# Patient Record
Sex: Female | Born: 1955 | Race: Black or African American | Hispanic: No | Smoking: Former smoker
Health system: Southern US, Community
[De-identification: ages and names within clinical notes are randomized; demographics above are authoritative.]

## PROBLEM LIST (undated history)

## (undated) DIAGNOSIS — I10 Essential (primary) hypertension: Secondary | ICD-10-CM

## (undated) HISTORY — PX: TONSILLECTOMY: SUR1361

---

## 1999-05-07 ENCOUNTER — Emergency Department (HOSPITAL_COMMUNITY): Admission: EM | Admit: 1999-05-07 | Discharge: 1999-05-07 | Payer: Self-pay | Admitting: Emergency Medicine

## 2004-03-04 ENCOUNTER — Emergency Department (HOSPITAL_COMMUNITY): Admission: EM | Admit: 2004-03-04 | Discharge: 2004-03-05 | Payer: Self-pay | Admitting: Emergency Medicine

## 2005-02-07 ENCOUNTER — Emergency Department (HOSPITAL_COMMUNITY): Admission: EM | Admit: 2005-02-07 | Discharge: 2005-02-07 | Payer: Self-pay | Admitting: Emergency Medicine

## 2005-05-14 ENCOUNTER — Emergency Department (HOSPITAL_COMMUNITY): Admission: EM | Admit: 2005-05-14 | Discharge: 2005-05-14 | Payer: Self-pay | Admitting: Emergency Medicine

## 2006-04-15 ENCOUNTER — Emergency Department (HOSPITAL_COMMUNITY): Admission: EM | Admit: 2006-04-15 | Discharge: 2006-04-15 | Payer: Self-pay | Admitting: Emergency Medicine

## 2011-08-25 ENCOUNTER — Emergency Department (HOSPITAL_COMMUNITY)
Admission: EM | Admit: 2011-08-25 | Discharge: 2011-08-26 | Disposition: A | Discharge status: Home / Self Care | Payer: Self-pay | Attending: Emergency Medicine | Admitting: Emergency Medicine

## 2011-08-25 ENCOUNTER — Other Ambulatory Visit: Payer: Self-pay

## 2011-08-25 ENCOUNTER — Encounter (HOSPITAL_COMMUNITY): Payer: Self-pay

## 2011-08-25 ENCOUNTER — Emergency Department (HOSPITAL_COMMUNITY): Payer: Self-pay

## 2011-08-25 DIAGNOSIS — E876 Hypokalemia: Secondary | ICD-10-CM | POA: Insufficient documentation

## 2011-08-25 DIAGNOSIS — R0602 Shortness of breath: Secondary | ICD-10-CM | POA: Insufficient documentation

## 2011-08-25 DIAGNOSIS — R110 Nausea: Secondary | ICD-10-CM | POA: Insufficient documentation

## 2011-08-25 DIAGNOSIS — R079 Chest pain, unspecified: Secondary | ICD-10-CM | POA: Insufficient documentation

## 2011-08-25 DIAGNOSIS — R0781 Pleurodynia: Secondary | ICD-10-CM

## 2011-08-25 DIAGNOSIS — I10 Essential (primary) hypertension: Secondary | ICD-10-CM | POA: Insufficient documentation

## 2011-08-25 DIAGNOSIS — F17200 Nicotine dependence, unspecified, uncomplicated: Secondary | ICD-10-CM | POA: Insufficient documentation

## 2011-08-25 LAB — COMPREHENSIVE METABOLIC PANEL
ALT: 11 U/L (ref 0–35)
AST: 15 U/L (ref 0–37)
Albumin: 3.6 g/dL (ref 3.5–5.2)
Alkaline Phosphatase: 90 U/L (ref 39–117)
BUN: 7 mg/dL (ref 6–23)
CO2: 28 mEq/L (ref 19–32)
Calcium: 10 mg/dL (ref 8.4–10.5)
Chloride: 105 mEq/L (ref 96–112)
Creatinine, Ser: 0.67 mg/dL (ref 0.50–1.10)
GFR calc Af Amer: >90 mL/min (ref >90)
GFR calc non Af Amer: >90 mL/min (ref >90)
Glucose, Bld: 102 mg/dL — ABNORMAL HIGH (ref 70–99)
Potassium: 3.3 mEq/L — ABNORMAL LOW (ref 3.5–5.1)
Sodium: 142 mEq/L (ref 135–145)
Total Bilirubin: 0.3 mg/dL (ref 0.3–1.2)
Total Protein: 7.6 g/dL (ref 6.0–8.3)

## 2011-08-25 LAB — DIFFERENTIAL
Basophils Absolute: 0 10*3/uL (ref 0.0–0.1)
Basophils Relative: 0 % (ref 0–1)
Eosinophils Absolute: 0.1 10*3/uL (ref 0.0–0.7)
Eosinophils Relative: 1 % (ref 0–5)
Lymphocytes Relative: 48 % — ABNORMAL HIGH (ref 12–46)
Lymphs Abs: 3.3 10*3/uL (ref 0.7–4.0)
Monocytes Absolute: 0.3 10*3/uL (ref 0.1–1.0)
Monocytes Relative: 5 % (ref 3–12)
Neutro Abs: 3.2 10*3/uL (ref 1.7–7.7)
Neutrophils Relative %: 46 % (ref 43–77)

## 2011-08-25 LAB — D-DIMER, QUANTITATIVE: D-Dimer, Quant: 0.38 ug/mL-FEU (ref 0.00–0.48)

## 2011-08-25 LAB — CBC
HCT: 34.2 % — ABNORMAL LOW (ref 36.0–46.0)
Hemoglobin: 12.5 g/dL (ref 12.0–15.0)
MCH: 31.3 pg (ref 26.0–34.0)
MCHC: 36.5 g/dL — ABNORMAL HIGH (ref 30.0–36.0)
MCV: 85.7 fL (ref 78.0–100.0)
Platelets: 251 10*3/uL (ref 150–400)
RBC: 3.99 MIL/uL (ref 3.87–5.11)
RDW: 14.7 % (ref 11.5–15.5)
WBC: 6.9 10*3/uL (ref 4.0–10.5)

## 2011-08-25 LAB — POCT I-STAT TROPONIN I: Troponin i, poc: 0.01 ng/mL (ref 0.00–0.08)

## 2011-08-25 LAB — PRO B NATRIURETIC PEPTIDE: Pro B Natriuretic peptide (BNP): 121 pg/mL (ref 0–125)

## 2011-08-25 MED ORDER — POTASSIUM CHLORIDE CRYS ER 20 MEQ PO TBCR
40.0000 meq | EXTENDED_RELEASE_TABLET | Freq: Once | ORAL | Status: AC
  Administered 2011-08-25: 40 meq via ORAL
  Filled 2011-08-25: qty 2

## 2011-08-25 MED ORDER — IBUPROFEN 800 MG PO TABS
800.0000 mg | ORAL_TABLET | Freq: Once | ORAL | Status: AC
  Administered 2011-08-25: 800 mg via ORAL
  Filled 2011-08-25: qty 1

## 2011-08-25 NOTE — ED Notes (Signed)
The patient states that she started feeling sharp, left-sided, chest pain @ 1900.  The patient states that she also felt SOB and nauseous.  The patient rated her pain as a 6/10.  Patient took ASA 324 mg, po at home and EMS gave nitro-stat 0.4 mg, po x1 which reduced her pain to 0/10.

## 2011-08-25 NOTE — ED Provider Notes (Signed)
History     CSN: [id number]  Arrival date & time 08/25/11  2130   First MD Initiated Contact with Patient 08/25/11 2149      Chief Complaint  Patient presents with  . Chest Pain    (Consider location/radiation/quality/duration/timing/severity/associated sxs/prior treatment) HPI  Patient relates last month she had a sharp "electrical bolt" of left sided chest pain that was sharp. It only lasted a second or 2. She relates tonight she was cooking about 7 PM and had again the bolt of lightening pain in her left chest that doubled her over. She states she laid across the kitchen steak but the pain only lasted 1-2 seconds. She relates it happened again and she called her daughter who came to stay with her for while. She relates her daughter had left and she was in bed reading and she had a third episode. She denies shortness of breath, she states she's had some clamminess but got that with her hot flashes, she's had nausea without vomiting. She states the pain does not radiate or go into her back. She does not get any pain that is constant.  PCP none  No past medical history on file.  Past Surgical History  Procedure Date  . Tonsillectomy     Family History  Problem Relation Age of Onset  . Hypertension Mother   . Stroke Mother   . Heart attack Mother   . Hypertension Father   . Stroke Father    sister 84 years old died of MI Mother living at 26 has had MI and stroke  History  Substance Use Topics  . Smoking status: Current Everyday Smoker -- 0.5 packs/day for 40 years  . Smokeless tobacco: Not on file  . 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    Allergies  Review of patient's allergies indicates no known allergies.  Home Medications   Current  Outpatient Rx  Name Route Sig Dispense Refill  . LORATADINE 10 MG PO TABS Oral Take 10 mg by mouth daily.      BP 177/91  Pulse 85  Temp(Src) 98.6 F (37 C) (Oral)  Resp 15  Ht 5\' 7"  (1.702 m)  Wt 185 lb (83.915 kg)  BMI 28.97 kg/m2  SpO2 2%  Vital signs normal except hypertension   Physical Exam  Constitutional: She is oriented to person, place, and time. She appears well-developed and well-nourished.  Non-toxic appearance. She does not appear ill. No distress.  HENT:  Head: Normocephalic and atraumatic.  Right Ear: External ear normal.  Left Ear: External ear normal.  Nose: Nose normal. No mucosal edema or rhinorrhea.  Mouth/Throat: Oropharynx is clear and moist and mucous membranes are normal. No dental abscesses or uvula swelling.  Eyes: Conjunctivae and EOM are normal. Pupils are equal, round, and reactive to light.  Neck: Normal range of motion and full passive range of motion without pain. Neck supple.  Cardiovascular: Normal rate, regular rhythm and normal heart sounds.  Exam reveals no gallop and no friction rub.   No murmur heard. Pulmonary/Chest: Effort normal and breath sounds normal. No respiratory distress. She has no wheezes. She has no rhonchi. She  has no rales. She exhibits no tenderness and no crepitus.  Abdominal: Soft. Normal appearance and bowel sounds are normal. She exhibits no distension. There is no tenderness. There is no rebound and no guarding.  Musculoskeletal: Normal range of motion. She exhibits no edema and no tenderness.       Moves all extremities well.   Neurological: She is alert and oriented to person, place, and time. She has normal strength. No cranial nerve deficit.  Skin: Skin is warm, dry and intact. No rash noted. No erythema. No pallor.  Psychiatric: She has a normal mood and affect. Her speech is normal and behavior is normal. Her mood appears not anxious.    ED Course  Procedures (including critical care time)   Results for  orders placed during the hospital encounter of 08/25/11  CBC      Component Value Range   WBC 6.9  4.0 - 10.5 (K/uL)   RBC 3.99  3.87 - 5.11 (MIL/uL)   Hemoglobin 12.5  12.0 - 15.0 (g/dL)   HCT 78.2 (*) 95.6 - 46.0 (%)   MCV 85.7  78.0 - 100.0 (fL)   MCH 31.3  26.0 - 34.0 (pg)   MCHC 36.5 (*) 30.0 - 36.0 (g/dL)   RDW 21.3  08.6 - 57.8 (%)   Platelets 251  150 - 400 (K/uL)  DIFFERENTIAL      Component Value Range   Neutrophils Relative 46  43 - 77 (%)   Neutro Abs 3.2  1.7 - 7.7 (K/uL)   Lymphocytes Relative 48 (*) 12 - 46 (%)   Lymphs Abs 3.3  0.7 - 4.0 (K/uL)   Monocytes Relative 5  3 - 12 (%)   Monocytes Absolute 0.3  0.1 - 1.0 (K/uL)   Eosinophils Relative 1  0 - 5 (%)   Eosinophils Absolute 0.1  0.0 - 0.7 (K/uL)   Basophils Relative 0  0 - 1 (%)   Basophils Absolute 0.0  0.0 - 0.1 (K/uL)  COMPREHENSIVE METABOLIC PANEL      Component Value Range   Sodium 142  135 - 145 (mEq/L)   Potassium 3.3 (*) 3.5 - 5.1 (mEq/L)   Chloride 105  96 - 112 (mEq/L)   CO2 28  19 - 32 (mEq/L)   Glucose, Bld 102 (*) 70 - 99 (mg/dL)   BUN 7  6 - 23 (mg/dL)   Creatinine, Ser 4.69  0.50 - 1.10 (mg/dL)   Calcium 62.9  8.4 - 10.5 (mg/dL)   Total Protein 7.6  6.0 - 8.3 (g/dL)   Albumin 3.6  3.5 - 5.2 (g/dL)   AST 15  0 - 37 (U/L)   ALT 11  0 - 35 (U/L)   Alkaline Phosphatase 90  39 - 117 (U/L)   Total Bilirubin 0.3  0.3 - 1.2 (mg/dL)   GFR calc non Af Amer >90  >90 (mL/min)   GFR calc Af Amer >90  >90 (mL/min)  D-DIMER, QUANTITATIVE      Component Value Range   D-Dimer, Quant 0.38  0.00 - 0.48 (ug/mL-FEU)  POCT I-STAT TROPONIN I      Component Value Range   Troponin i, poc 0.01  0.00 - 0.08 (ng/mL)   Comment 3           PRO B NATRIURETIC PEPTIDE      Component Value Range   Pro B Natriuretic peptide (BNP) 121.0  0 - 125 (pg/mL)   Laboratory interpretation all normal except mild hypokalemia  Dg  Chest 2 View  08/25/2011  *RADIOLOGY REPORT*  Clinical Data: Left chest pain, history smoking   CHEST - 2 VIEW  Comparison: None  Findings: Borderline enlargement of cardiac silhouette. Slight pulmonary vascular congestion. Mediastinal contours normal. Bronchitic changes without infiltrate or effusion. No pneumothorax. Minimal bibasilar atelectasis noted. Osseous mineralization normal. No acute bony findings identified.  IMPRESSION: Borderline enlargement of cardiac silhouette with slight pulmonary vascular congestion. Bronchitic changes with minimal bibasilar atelectasis.  Original Report Authenticated By: Lollie Marrow, M.D.       Date: 08/25/2011  Rate: 87  Rhythm: normal sinus rhythm and premature ventricular contractions (PVC)  QRS Axis: normal  Intervals: normal  ST/T Wave abnormalities: nonspecific T wave changes  Conduction Disutrbances:none  Narrative Interpretation:   Old EKG Reviewed: none available    Dg Chest 2 View  08/25/2011  *RADIOLOGY REPORT*  Clinical Data: Left chest pain, history smoking  CHEST - 2 VIEW  Comparison: None  Findings: Borderline enlargement of cardiac silhouette. Slight pulmonary vascular congestion. Mediastinal contours normal. Bronchitic changes without infiltrate or effusion. No pneumothorax. Minimal bibasilar atelectasis noted. Osseous mineralization normal. No acute bony findings identified.  IMPRESSION: Borderline enlargement of cardiac silhouette with slight pulmonary vascular congestion. Bronchitic changes with minimal bibasilar atelectasis.  Original Report Authenticated By: Lollie Marrow, M.D.     1. Pleuritic chest pain   2. Hypokalemia   3. Hypertension    New Prescriptions   POTASSIUM CHLORIDE SA (K-DUR,KLOR-CON) 20 MEQ TABLET    Take 1 tablet (20 mEq total) by mouth 2 (two) times daily.   Plan discharge Devoria Albe, MD, FACEP    MDM          Ward Givens, MD 08/26/11 [phone]

## 2011-08-25 NOTE — ED Notes (Signed)
CORRECTION:  Patient is at 97% on 2L.

## 2011-08-26 MED ORDER — POTASSIUM CHLORIDE CRYS ER 20 MEQ PO TBCR: 20.0000 meq | EXTENDED_RELEASE_TABLET | Freq: Two times a day (BID) | ORAL | Status: AC

## 2011-08-26 NOTE — Discharge Instructions (Signed)
Your tests are good, you only have a mild low potassium. Take the potassium pills until gone, then try to eat a banana a day. Take ibuprofen 600 mg every 6 hrs for pain. Recheck if you get a fever, cough, feel short of breath or have a constant pain. Look at the resource guide to get a primary care doctor or see if your mothers doctor will also see you. You need to have your blood pressure rechecked again, it was a little high tonight and you may need to start medications.    RESOURCE GUIDE  Insufficient Money for Medicine Contact [employer]:  call "211" or [facility] [phone].  No Primary Care Doctor Call [facility]  [phone] Other agencies that provide inexpensive medical care    [facility]  [phone]    [facility]  [phone]    [facility]  [phone]    [facility]  [phone]    [facility]  [phone]    [facility]  [phone]

## 2014-04-05 ENCOUNTER — Encounter (HOSPITAL_COMMUNITY): Payer: Self-pay

## 2014-08-24 ENCOUNTER — Encounter (HOSPITAL_COMMUNITY): Payer: Self-pay

## 2014-08-24 ENCOUNTER — Emergency Department (HOSPITAL_COMMUNITY): Payer: Self-pay

## 2014-08-24 ENCOUNTER — Observation Stay (HOSPITAL_COMMUNITY)
Admission: EM | Admit: 2014-08-24 | Discharge: 2014-08-25 | Disposition: A | Discharge status: Home / Self Care | Payer: Self-pay | Attending: Internal Medicine | Admitting: Internal Medicine

## 2014-08-24 DIAGNOSIS — Z823 Family history of stroke: Secondary | ICD-10-CM | POA: Insufficient documentation

## 2014-08-24 DIAGNOSIS — Z8249 Family history of ischemic heart disease and other diseases of the circulatory system: Secondary | ICD-10-CM | POA: Insufficient documentation

## 2014-08-24 DIAGNOSIS — R2681 Unsteadiness on feet: Secondary | ICD-10-CM | POA: Insufficient documentation

## 2014-08-24 DIAGNOSIS — N390 Urinary tract infection, site not specified: Secondary | ICD-10-CM | POA: Insufficient documentation

## 2014-08-24 DIAGNOSIS — I10 Essential (primary) hypertension: Secondary | ICD-10-CM | POA: Diagnosis present

## 2014-08-24 DIAGNOSIS — R29898 Other symptoms and signs involving the musculoskeletal system: Secondary | ICD-10-CM

## 2014-08-24 DIAGNOSIS — R269 Unspecified abnormalities of gait and mobility: Secondary | ICD-10-CM | POA: Insufficient documentation

## 2014-08-24 DIAGNOSIS — R531 Weakness: Principal | ICD-10-CM | POA: Insufficient documentation

## 2014-08-24 DIAGNOSIS — R251 Tremor, unspecified: Secondary | ICD-10-CM | POA: Diagnosis present

## 2014-08-24 DIAGNOSIS — F17210 Nicotine dependence, cigarettes, uncomplicated: Secondary | ICD-10-CM | POA: Insufficient documentation

## 2014-08-24 LAB — RAPID URINE DRUG SCREEN, HOSP PERFORMED
Amphetamines: NOT DETECTED
Barbiturates: NOT DETECTED
Benzodiazepines: NOT DETECTED
Cocaine: NOT DETECTED
Opiates: NOT DETECTED
Tetrahydrocannabinol: NOT DETECTED

## 2014-08-24 LAB — URINALYSIS, ROUTINE W REFLEX MICROSCOPIC
Bilirubin Urine: NEGATIVE
Glucose, UA: NEGATIVE mg/dL
Ketones, ur: NEGATIVE mg/dL
Nitrite: NEGATIVE
Protein, ur: NEGATIVE mg/dL
Specific Gravity, Urine: 1.005 (ref 1.005–1.030)
Urobilinogen, UA: 0.2 mg/dL (ref 0.0–1.0)
pH: 7 (ref 5.0–8.0)

## 2014-08-24 LAB — URINE MICROSCOPIC-ADD ON

## 2014-08-24 LAB — DIFFERENTIAL
Basophils Absolute: 0.1 10*3/uL (ref 0.0–0.1)
Basophils Relative: 1 % (ref 0–1)
Eosinophils Absolute: 0 10*3/uL (ref 0.0–0.7)
Eosinophils Relative: 1 % (ref 0–5)
Lymphocytes Relative: 48 % — ABNORMAL HIGH (ref 12–46)
Lymphs Abs: 3 10*3/uL (ref 0.7–4.0)
Monocytes Absolute: 0.3 10*3/uL (ref 0.1–1.0)
Monocytes Relative: 5 % (ref 3–12)
Neutro Abs: 2.8 10*3/uL (ref 1.7–7.7)
Neutrophils Relative %: 45 % (ref 43–77)

## 2014-08-24 LAB — I-STAT CHEM 8, ED
BUN: 6 mg/dL (ref 6–23)
Calcium, Ion: 1.27 mmol/L — ABNORMAL HIGH (ref 1.12–1.23)
Chloride: 104 mmol/L (ref 96–112)
Creatinine, Ser: 0.7 mg/dL (ref 0.50–1.10)
Glucose, Bld: 105 mg/dL — ABNORMAL HIGH (ref 70–99)
HCT: 42 % (ref 36.0–46.0)
Hemoglobin: 14.3 g/dL (ref 12.0–15.0)
Potassium: 3.3 mmol/L — ABNORMAL LOW (ref 3.5–5.1)
Sodium: 144 mmol/L (ref 135–145)
TCO2: 24 mmol/L (ref 0–100)

## 2014-08-24 LAB — PROTIME-INR
INR: 1.09 (ref 0.00–1.49)
Prothrombin Time: 14.2 seconds (ref 11.6–15.2)

## 2014-08-24 LAB — APTT: aPTT: 37 seconds (ref 24–37)

## 2014-08-24 LAB — ETHANOL: Alcohol, Ethyl (B): <5 mg/dL (ref 0–9)

## 2014-08-24 LAB — I-STAT TROPONIN, ED: Troponin i, poc: 0 ng/mL (ref 0.00–0.08)

## 2014-08-24 MED ORDER — ASPIRIN EC 81 MG PO TBEC
81.0000 mg | DELAYED_RELEASE_TABLET | Freq: Every day | ORAL | Status: DC
  Administered 2014-08-25: 81 mg via ORAL
  Filled 2014-08-24: qty 1

## 2014-08-24 MED ORDER — SODIUM CHLORIDE 0.9 % IV SOLN
INTRAVENOUS | Status: AC
  Administered 2014-08-24: 23:00:00 via INTRAVENOUS

## 2014-08-24 MED ORDER — LORAZEPAM 2 MG/ML IJ SOLN
1.0000 mg | Freq: Once | INTRAMUSCULAR | Status: AC
  Administered 2014-08-24: 1 mg via INTRAVENOUS
  Filled 2014-08-24: qty 1

## 2014-08-24 MED ORDER — ENSURE ENLIVE PO LIQD: 237.0000 mL | Freq: Two times a day (BID) | ORAL | Status: DC

## 2014-08-24 MED ORDER — HEPARIN SODIUM (PORCINE) 5000 UNIT/ML IJ SOLN
5000.0000 [IU] | Freq: Three times a day (TID) | INTRAMUSCULAR | Status: DC
  Administered 2014-08-25 (×2): 5000 [IU] via SUBCUTANEOUS
  Filled 2014-08-24 (×3): qty 1

## 2014-08-24 MED ORDER — LORAZEPAM 2 MG/ML IJ SOLN
1.0000 mg | Freq: Once | INTRAMUSCULAR | Status: AC
  Administered 2014-08-24: 1 mg via INTRAVENOUS

## 2014-08-24 NOTE — ED Notes (Signed)
MD at bedside.IM at bedside

## 2014-08-24 NOTE — H&P (Signed)
Date: 08/24/2014               Patient Name:  Laurie HansonWanda F Atteberry MRN: [id number]  DOB: 06/19/1955 Age / Sex: 59 y.o., female   PCP: No primary care provider on file.         Medical Service: Internal Medicine Teaching Service         Attending Physician: Dr. Aletta EdouardShilpa Bhardwaj, MD    First Contact: Dr. Gara Kroneriana Truong  Pager: [phone]  Second Contact: Dr. Carlynn PurlErik Hoffman  Pager: [phone]       After Hours (After 5p/  First Contact Pager: [phone]  weekends / holidays): Second Contact Pager: [phone]   Chief Complaint: "Wobbly legs"  History of Present Illness: Ms. Laurie BouquetShepard is a 59 year old female with no known past medical history who presents to the ED with chief complaint "wobbly legs." Her daughter and son are present in the room and contributed interview.  Around 1 PM, she reports being at home and getting ready for work when she noticed that her legs became wobbly which she describes as "legs and feet were not in the shoe." She describes that the symptoms persisted for roughly an hour. Her daughter reported that as she saw her mother leaving the house, she was walking "funny" to the car. They were both going to visit the grave of the patient's mother as today was her birthday. Upon arriving at the flower store, the daughter noted that as the mother was attempting to get out of the car, she also had gait abnormality similar to when she saw her leaving the house. At that point, the daughter went to support her mother and caught her from falling. She then brought her straight to the emergency department , and in triage, her mother developed twitchiness of her left upper and lower extremity which persisted for no more than 10 minutes. The mother also reports that she had similar symptoms in the car ride to the daughter was not able to see them. Of note, the patient's best friend was taken off of life support and passed away earlier today as well. The daughter reports that the patient has been under stress  over the last 7 days given the anniversary of the death of the daughter's husband. During all of these events, the patient denies any loss of consciousness. She denies any recent alcohol or illicit drug use though smokes half a pack per day since the age of 59. Aside for a history of intermittent headache which she describes as a "throbbing sensation in the back of her head," she denies any dizziness, numbness/tingling, shortness of breath, chest pain, abdominal pain, nausea, vomiting, diarrhea, recent poor appetite, prior history of depression or anxiety, active suicidal or homicidal ideation; daughter also called the patient was verbalizing coherently and laughing in the car ride without any signs of facial droop or asymmetry. She lives at home by herself and works at a liquor store. In the emergency department, initial head CT unremarkable for acute infarct or bleed. Neurology was consulted and recommended cervical and thoracic spine MRI which were notable for lateralizing leftward disc protrusion at C6/C7 with possible involvement of left C7 nerve root and central protrusion at C5/C6.   Meds: No current facility-administered medications for this encounter.   Current Outpatient Prescriptions  Medication Sig Dispense Refill  . loratadine (CLARITIN) 10 MG tablet Take 10 mg by mouth daily as needed for allergies.       Allergies: Allergies as of 08/24/2014  . (  No Known Allergies)   History reviewed. No pertinent past medical history. Past Surgical History  Procedure Laterality Date  . Tonsillectomy     Family History  Problem Relation Age of Onset  . Hypertension Mother   . Stroke Mother   . Heart attack Mother   . Hypertension Father   . Stroke Father    History   Social History  . Marital Status: Divorced    Spouse Name: N/A  . Number of Children: N/A  . Years of Education: N/A   Occupational History  . Not on file.   Social History Main Topics  . Smoking status: Current  Every Day Smoker -- 0.50 packs/day for 40 years  . Smokeless tobacco: Not on file  . Alcohol Use: 0.6 oz/week    1 Cans of beer per week  . Drug Use: No  . Sexual Activity: Yes    Birth Control/ Protection: Condom   Other Topics Concern  . Not on file   Social History Narrative    Review of Systems: As noted in the history of present illness   Physical Exam: Blood pressure 180/93, pulse 85, temperature 98.4 F (36.9 C), temperature source Oral, resp. rate 24, height  (1.702 m), weight 180 lb (81.647 kg), SpO2 96 %.  General: African-American female, tired appearing, resting in bed, NAD HEENT: PERRL, EOMI, no scleral icterus, oropharynx with dry mucous membranes Cardiac: RRR, no rubs, murmurs or gallops Pulm: Rhonchorous sounds noted on expiration Abd: soft, nontender, nondistended, BS present Ext: warm and well perfused, no pedal or tibial edema Neuro: CN II-XII intact, 5/5 upper and lower extremity strength though left side improved with prompting and encouragement, 2+ grip strength  Lab results: Basic Metabolic Panel:  Recent Labs  08/65/78 1521  NA 144  K 3.3*  CL 104  GLUCOSE 105*  BUN 6  CREATININE 0.70   CBC:  Recent Labs  08/24/14 1500 08/24/14 1521  NEUTROABS 2.8  --   HGB  --  14.3  HCT  --  42.0   Coagulation:  Recent Labs  08/24/14 1500  LABPROT 14.2  INR 1.09   Urine Drug Screen: Drugs of Abuse     Component Value Date/Time   LABOPIA NONE DETECTED 08/24/2014 1558   COCAINSCRNUR NONE DETECTED 08/24/2014 1558   LABBENZ NONE DETECTED 08/24/2014 1558   AMPHETMU NONE DETECTED 08/24/2014 1558   THCU NONE DETECTED 08/24/2014 1558   LABBARB NONE DETECTED 08/24/2014 1558    Alcohol Level:  Recent Labs  08/24/14 1500  ETH <5   Urinalysis:  Recent Labs  08/24/14 1558  COLORURINE YELLOW  LABSPEC 1.005  PHURINE 7.0  GLUCOSEU NEGATIVE  HGBUR TRACE*  BILIRUBINUR NEGATIVE  KETONESUR NEGATIVE  PROTEINUR NEGATIVE  UROBILINOGEN  0.2  NITRITE NEGATIVE  LEUKOCYTESUR SMALL*    Imaging results:  Dg Chest 2 View  08/24/2014   CLINICAL DATA:  Fatigue. Dizziness. Weakness. Symptoms started today.  EXAM: CHEST  2 VIEW  COMPARISON:  08/25/2011  FINDINGS: Mild enlargement of the cardiopericardial silhouette, without edema. Old granulomatous disease. The lungs appear otherwise clear. No pleural effusion.  IMPRESSION: 1. Mild enlargement of the cardiopericardial silhouette, without edema.   Electronically Signed   By: Gaylyn Rong M.D.   On: 08/24/2014 19:37   Ct Head Wo Contrast  08/24/2014   CLINICAL DATA:  Code stroke, leg weakness at 0600 hours, began falling at 1315 hours when trying to walk, fatigue, dizziness  EXAM: CT HEAD WITHOUT CONTRAST  TECHNIQUE:  Contiguous axial images were obtained from the base of the skull through the vertex without intravenous contrast.  COMPARISON:  04/15/2006  FINDINGS: Few beam hardening artifacts at skullbase.  Normal ventricular morphology.  No midline shift or mass effect.  Minimal small vessel chronic ischemic changes of deep cerebral white matter.  Otherwise normal appearance of brain parenchyma.  No intracranial hemorrhage, mass lesion or evidence acute infarction.  No extra-axial fluid collections.  Bones and sinuses unremarkable.  IMPRESSION: Minimal small vessel chronic ischemic changes of deep cerebral white matter.  No acute intracranial abnormalities.  Findings called to Dr. Roseanne Reno on 08/24/2014 at 1532 hours.   Electronically Signed   By: Ulyses Southward M.D.   On: 08/24/2014 15:32   Mr Cervical Spine Wo Contrast  08/24/2014   CLINICAL DATA:  Patient complains of LEFT-sided weakness after attending a funeral. Inconsistent neurologic exam.  EXAM: MRI CERVICAL AND THORACIC SPINE WITHOUT CONTRAST  TECHNIQUE: Multiplanar and multiecho pulse sequences of the cervical spine, to include the craniocervical junction and cervicothoracic junction, and lumbar spine, were obtained without  intravenous contrast.  COMPARISON:  None.  FINDINGS: Examination is severely motion degraded. The patient had to be removed from the scanner multiple times. Study is diagnostic to exclude cord compression.  MRI CERVICAL SPINE FINDINGS  Normal cord size and signal throughout. No tonsillar herniation. No intraspinal mass lesion or cord hyperintensity. No osseous lesion.  Intervertebral disc spaces are preserved. Anatomic alignment. No craniocervical junction anomaly.  Axial images:  -Shallow protrusion at C4-5 is noncompressive.  -Slightly larger protrusion at C5-6 effaces the anterior subarachnoid space and mildly flattens the cord. This appearance is less impressive on sagittal T1 and T2 imaging however. Sagittal diameter spinal canal measures at least 7 mm on the non degraded sagittal images.  -Shallow protrusion at C6-7 is eccentric to the LEFT and could affect the LEFT C7 nerve root.  MRI THORACIC SPINE FINDINGS  Numbering of the thoracic spine was accomplished by counting down the odontoid.  No thoracic compression fracture or subluxation. Normal cord size and signal throughout. Minor thoracic disc pathology appears noncompressive. No intraspinal mass lesion. No osseous abnormality to suggest infection or tumor. No gross paravertebral mass.  IMPRESSION: Severely motion degraded exam of marginal diagnostic utility.  No areas of cord compression, or worrisome vertebral body lesion.  Lateralizing leftward disc protrusion at C6-7 could affect the LEFT C7 nerve root, as described above.  Central protrusion C5-6 slightly effaces the cord and subarachnoid space but does not appear to be a significant compressive lesion based on non motion degraded sagittal T1-T2 images.   Electronically Signed   By: Davonna Belling M.D.   On: 08/24/2014 18:40   Mr Thoracic Spine Wo Contrast  08/24/2014   CLINICAL DATA:  Patient complains of LEFT-sided weakness after attending a funeral. Inconsistent neurologic exam.  EXAM: MRI CERVICAL  AND THORACIC SPINE WITHOUT CONTRAST  TECHNIQUE: Multiplanar and multiecho pulse sequences of the cervical spine, to include the craniocervical junction and cervicothoracic junction, and lumbar spine, were obtained without intravenous contrast.  COMPARISON:  None.  FINDINGS: Examination is severely motion degraded. The patient had to be removed from the scanner multiple times. Study is diagnostic to exclude cord compression.  MRI CERVICAL SPINE FINDINGS  Normal cord size and signal throughout. No tonsillar herniation. No intraspinal mass lesion or cord hyperintensity. No osseous lesion.  Intervertebral disc spaces are preserved. Anatomic alignment. No craniocervical junction anomaly.  Axial images:  -Shallow protrusion at C4-5 is noncompressive.  -  Slightly larger protrusion at C5-6 effaces the anterior subarachnoid space and mildly flattens the cord. This appearance is less impressive on sagittal T1 and T2 imaging however. Sagittal diameter spinal canal measures at least 7 mm on the non degraded sagittal images.  -Shallow protrusion at C6-7 is eccentric to the LEFT and could affect the LEFT C7 nerve root.  MRI THORACIC SPINE FINDINGS  Numbering of the thoracic spine was accomplished by counting down the odontoid.  No thoracic compression fracture or subluxation. Normal cord size and signal throughout. Minor thoracic disc pathology appears noncompressive. No intraspinal mass lesion. No osseous abnormality to suggest infection or tumor. No gross paravertebral mass.  IMPRESSION: Severely motion degraded exam of marginal diagnostic utility.  No areas of cord compression, or worrisome vertebral body lesion.  Lateralizing leftward disc protrusion at C6-7 could affect the LEFT C7 nerve root, as described above.  Central protrusion C5-6 slightly effaces the cord and subarachnoid space but does not appear to be a significant compressive lesion based on non motion degraded sagittal T1-T2 images.   Electronically Signed   By:  Davonna Belling M.D.   On: 08/24/2014 18:40    Other results: EKG: Reviewed and compared with 08/25/2011 Normal axis, sinus rhythm Q waves in V2, stable from prior T-wave inversions in V5, V6, as noted on prior though with mild increase   Assessment & Plan by Problem:  Left leg weakness with decreased sensation: Likely related to acute strep stress. Imaging and physical exam findings do not correlate with a neurologic process, like CVA. As she was able to recall the details of these events, syncope and seizures are also less likely. Toxin ingestion less likely as UDS and serum ethanol were both negative. Aside from mild hypokalemia noted on ED labs, there are no other electrolyte disturbances to account for her current symptoms. Mild EKG changes though initial troponin negative in the ED and absence of cardiac symptoms makes ACS less likely as well. Certainly, psychiatric diagnoses are one of exclusion which appears to be the case here though she does not have a prior history of psychiatric illness or active homicidal/suicidal ideation which is otherwise reassuring. -Continue neuro checks every 2 hours 6 hours -Recheck BMET and CBC -Give aspirin 81 mg -Check orthostatics and start normal saline at 100 mL/hour -Consult PT/OT -Monitor on telemetry  Hypertensive urgency: Initial blood pressure 189/103 in the emergency department. She does not take any regular medications at home and is likely elevated at baseline. Since she has been in the hospital, systolic blood pressure has been maintaining 170-180. -Continue monitoring consider starting antihypertensive therapy  Bacteriuria: UA notable for many bacteria, small leukocytes though small squamous cells suggests contamination. She is without symptoms. -Repeat UA  #FEN:  -Diet: Heart healthy  #DVT prophylaxis: Heparin  #CODE STATUS: DNR/DNI -Defer to daughter Dennis Bast was recently made the healthcare power of attorney] Ladene Artist  [phone] if patients lacks decision-making capacity -Confirmed with patient on admission   Dispo: Disposition is deferred at this time, awaiting improvement of current medical problems.   The patient does not have a current PCP (No primary care provider on file.) and does need an [facility] hospital follow-up appointment after discharge.  The patient does not have transportation limitations that hinder transportation to clinic appointments.  Signed: Beather Arbour, MD 08/24/2014, 8:39 PM

## 2014-08-24 NOTE — Code Documentation (Signed)
58yo [facility] at 14[facility].  Daughter reports that she called the patient several times this morning and everything was fine.  Daughter came over to patient's house and noticed that patient's legs were weak while walking to the car, she was also veering away from the car.   She was later unable to get out of the car and her daughter brought her to the hospital.  Code stroke activated.  Patient to CT.  Stroke team to the bedside.  LKW 1315.  NIHSS 6, see documentation for details and code stroke times.  Motor exam is inconsistent and patient with bilateral arm spastic movements.  Of note, today is the anniversary of her mother's death and her best friend recently passed away.  Dr. Roseanne RenoStewart at the bedside for exam.  No acute stroke treatment at this time.  Code stroke canceled at 1531.  Bedside handoff with ED RN Shanda BumpsJessica.

## 2014-08-24 NOTE — ED Notes (Signed)
Went to MRI to given PT 1mg  of ativan.

## 2014-08-24 NOTE — ED Notes (Signed)
Family at bedside. 

## 2014-08-24 NOTE — Progress Notes (Signed)
Received report from Jessica, RN in ED.  

## 2014-08-24 NOTE — Plan of Care (Signed)
Problem: Phase I Progression Outcomes Goal: OOB as tolerated unless otherwise ordered Outcome: Progressing With two assist

## 2014-08-24 NOTE — ED Notes (Addendum)
Pt was getting ready and noticed both of her legs felt like "jelly" which was around 1315. Daughter got there and noticed that she was walking away from the car. Pt feels like her legs feel wobbly. Pt denies any pain. Speech normal, grips equal.

## 2014-08-24 NOTE — ED Provider Notes (Signed)
CSN: [id number]     Arrival date & time 08/24/14  1434 History   First MD Initiated Contact with Patient 08/24/14 1516     Chief Complaint  Patient presents with  . Fatigue  . Dizziness     (Consider location/radiation/quality/duration/timing/severity/associated sxs/prior Treatment) HPI Pt is a 59yo female with no significant PMH, however, daughter states pt has not f/u with a PCP in "many years" and would not know if she had high blood pressure or high cholesterol, presenting to ED accompanied by family members with reports of "jelly" sensation in her legs around 1315. Timeline of symptom onset is somewhat conflicting as pt states she felt like her legs "felt weird" this morning when she woke up around 6:30AM, however daughter states earlier today she called the pt to let her know pt's best friend just died.  Daughter reports calling pt several times this morning and everything seemed fine.  Then, around 1315 when she picked up pt from home, pt had difficulty walking to the car. Pt appeared to be walking sideways.  Pt and daughter also report pt having a tremor in her left arm and left leg.  Tremor became worse when pt arrived in ED. Pt denies Headache, neck pain, chest pain or abdominal pain. Denies recent illness of fever, n/v/d.   Reports mild altered sensation in her Left arm and Left leg.  Denies change in speech or facial droop.  Pt has hx of Bell's Palsy in 2000 but states she does not feel like she is having the same symptoms as she did when she had Bell's Palsy. No previous hx of stroke or seizure.    History reviewed. No pertinent past medical history. Past Surgical History  Procedure Laterality Date  . Tonsillectomy     Family History  Problem Relation Age of Onset  . Hypertension Mother   . Stroke Mother   . Heart attack Mother   . Hypertension Father   . Stroke Father    History  Substance Use Topics  . Smoking status: Current Every Day Smoker -- 0.50 packs/day for 40 years   . Smokeless tobacco: Not on file  . Alcohol Use: 0.6 oz/week    1 Cans of beer per week   OB History    Gravida Para Term Preterm AB TAB SAB Ectopic Multiple Living   Review of Systems  Constitutional: Negative for fever, chills, appetite change and fatigue.  Eyes: Negative for photophobia and visual disturbance.  Respiratory: Negative for cough and shortness of breath.   Cardiovascular: Negative for chest pain and palpitations.  Gastrointestinal: Negative for nausea, vomiting, abdominal pain and diarrhea.  Musculoskeletal: Negative for myalgias, back pain, neck pain and neck stiffness.  Skin: Negative for rash and wound.  Neurological: Positive for tremors ( left arm and left leg) and numbness ( left arm and left leg). Negative for dizziness, seizures, syncope, facial asymmetry, speech difficulty, weakness, light-headedness and headaches.  All other systems reviewed and are negative.     Allergies  Review of patient's allergies indicates no known allergies.  Home Medications   Prior to Admission medications   Medication Sig Start Date End Date Taking? Authorizing Provider  loratadine (CLARITIN) 10 MG tablet Take 10 mg by mouth daily as needed for allergies.    Yes Historical Provider, MD   BP 182/77 mmHg  Pulse 80  Temp(Src) 98.4 F (36.9 C) (Oral)  Resp 23  Ht 5\' 7"  (1.702 m)  Wt 180 lb (81.647 kg)  BMI 28.19 kg/m2  SpO2 94% Physical Exam  Constitutional: She is oriented to person, place, and time. She appears well-developed and well-nourished. No distress.  HENT:  Head: Normocephalic and atraumatic.  Eyes: Conjunctivae are normal. No scleral icterus.  Neck: Normal range of motion.  Cardiovascular: Normal rate, regular rhythm and normal heart sounds.   Pulmonary/Chest: Effort normal and breath sounds normal. No respiratory distress. She has no wheezes. She has no rales. She exhibits no tenderness.  Abdominal: Soft. Bowel sounds are normal. She  exhibits no distension and no mass. There is no tenderness. There is no rebound and no guarding.  Musculoskeletal: Normal range of motion.  Neurological: She is alert and oriented to person, place, and time. She has normal strength. She displays tremor. She displays no atrophy. A sensory deficit is present. No cranial nerve deficit.  Reflex Scores:      Patellar reflexes are 2+ on the right side and 2+ on the left side. CN II-XII in tact. Speech is clear. No facial asymmetry. Pt able to perform finger to nose coordination as well as heal to shin but when using left arm and left leg, exaggerated tremor present.  When left arm and leg are at rest, no tremor present.  Pt unsafe to walk to due "wobbly" left leg.  Sensation altered per pt in left arm and left leg with light touch.   Skin: Skin is warm and dry. She is not diaphoretic.  Nursing note and vitals reviewed.   ED Course  Procedures (including critical care time) Labs Review Labs Reviewed  DIFFERENTIAL - Abnormal; Notable for the following:    Lymphocytes Relative 48 (*)    All other components within normal limits  URINALYSIS, ROUTINE W REFLEX MICROSCOPIC - Abnormal; Notable for the following:    Hgb urine dipstick TRACE (*)    Leukocytes, UA SMALL (*)    All other components within normal limits  URINE MICROSCOPIC-ADD ON - Abnormal; Notable for the following:    Squamous Epithelial / LPF FEW (*)    Bacteria, UA MANY (*)    All other components within normal limits  I-STAT CHEM 8, ED - Abnormal; Notable for the following:    Potassium 3.3 (*)    Glucose, Bld 105 (*)    Calcium, Ion 1.27 (*)    All other components within normal limits  ETHANOL  URINE RAPID DRUG SCREEN (HOSP PERFORMED)  PROTIME-INR  APTT  CBC  BASIC METABOLIC PANEL  PROTIME-INR  APTT  I-STAT TROPOININ, ED    Imaging Review Dg Chest 2 View  08/24/2014   CLINICAL DATA:  Fatigue. Dizziness. Weakness. Symptoms started today.  EXAM: CHEST  2 VIEW   COMPARISON:  08/25/2011  FINDINGS: Mild enlargement of the cardiopericardial silhouette, without edema. Old granulomatous disease. The lungs appear otherwise clear. No pleural effusion.  IMPRESSION: 1. Mild enlargement of the cardiopericardial silhouette, without edema.   Electronically Signed   By: Gaylyn RongWalter  Liebkemann M.D.   On: 08/24/2014 19:37   Ct Head Wo Contrast  08/24/2014   CLINICAL DATA:  Code stroke, leg weakness at 0600 hours, began falling at 1315 hours when trying to walk, fatigue, dizziness  EXAM: CT HEAD WITHOUT CONTRAST  TECHNIQUE: Contiguous axial images were obtained from the base of the skull through the vertex without intravenous contrast.  COMPARISON:  04/15/2006  FINDINGS: Few beam hardening artifacts at skullbase.  Normal ventricular morphology.  No midline shift  or mass effect.  Minimal small vessel chronic ischemic changes of deep cerebral white matter.  Otherwise normal appearance of brain parenchyma.  No intracranial hemorrhage, mass lesion or evidence acute infarction.  No extra-axial fluid collections.  Bones and sinuses unremarkable.  IMPRESSION: Minimal small vessel chronic ischemic changes of deep cerebral white matter.  No acute intracranial abnormalities.  Findings called to Dr. Roseanne Reno on 08/24/2014 at 1532 hours.   Electronically Signed   By: Ulyses Southward M.D.   On: 08/24/2014 15:32   Mr Cervical Spine Wo Contrast  08/24/2014   CLINICAL DATA:  Patient complains of LEFT-sided weakness after attending a funeral. Inconsistent neurologic exam.  EXAM: MRI CERVICAL AND THORACIC SPINE WITHOUT CONTRAST  TECHNIQUE: Multiplanar and multiecho pulse sequences of the cervical spine, to include the craniocervical junction and cervicothoracic junction, and lumbar spine, were obtained without intravenous contrast.  COMPARISON:  None.  FINDINGS: Examination is severely motion degraded. The patient had to be removed from the scanner multiple times. Study is diagnostic to exclude cord  compression.  MRI CERVICAL SPINE FINDINGS  Normal cord size and signal throughout. No tonsillar herniation. No intraspinal mass lesion or cord hyperintensity. No osseous lesion.  Intervertebral disc spaces are preserved. Anatomic alignment. No craniocervical junction anomaly.  Axial images:  -Shallow protrusion at C4-5 is noncompressive.  -Slightly larger protrusion at C5-6 effaces the anterior subarachnoid space and mildly flattens the cord. This appearance is less impressive on sagittal T1 and T2 imaging however. Sagittal diameter spinal canal measures at least 7 mm on the non degraded sagittal images.  -Shallow protrusion at C6-7 is eccentric to the LEFT and could affect the LEFT C7 nerve root.  MRI THORACIC SPINE FINDINGS  Numbering of the thoracic spine was accomplished by counting down the odontoid.  No thoracic compression fracture or subluxation. Normal cord size and signal throughout. Minor thoracic disc pathology appears noncompressive. No intraspinal mass lesion. No osseous abnormality to suggest infection or tumor. No gross paravertebral mass.  IMPRESSION: Severely motion degraded exam of marginal diagnostic utility.  No areas of cord compression, or worrisome vertebral body lesion.  Lateralizing leftward disc protrusion at C6-7 could affect the LEFT C7 nerve root, as described above.  Central protrusion C5-6 slightly effaces the cord and subarachnoid space but does not appear to be a significant compressive lesion based on non motion degraded sagittal T1-T2 images.   Electronically Signed   By: Davonna Belling M.D.   On: 08/24/2014 18:40   Mr Thoracic Spine Wo Contrast  08/24/2014   CLINICAL DATA:  Patient complains of LEFT-sided weakness after attending a funeral. Inconsistent neurologic exam.  EXAM: MRI CERVICAL AND THORACIC SPINE WITHOUT CONTRAST  TECHNIQUE: Multiplanar and multiecho pulse sequences of the cervical spine, to include the craniocervical junction and cervicothoracic junction, and lumbar  spine, were obtained without intravenous contrast.  COMPARISON:  None.  FINDINGS: Examination is severely motion degraded. The patient had to be removed from the scanner multiple times. Study is diagnostic to exclude cord compression.  MRI CERVICAL SPINE FINDINGS  Normal cord size and signal throughout. No tonsillar herniation. No intraspinal mass lesion or cord hyperintensity. No osseous lesion.  Intervertebral disc spaces are preserved. Anatomic alignment. No craniocervical junction anomaly.  Axial images:  -Shallow protrusion at C4-5 is noncompressive.  -Slightly larger protrusion at C5-6 effaces the anterior subarachnoid space and mildly flattens the cord. This appearance is less impressive on sagittal T1 and T2 imaging however. Sagittal diameter spinal canal measures at least 7 mm on  the non degraded sagittal images.  -Shallow protrusion at C6-7 is eccentric to the LEFT and could affect the LEFT C7 nerve root.  MRI THORACIC SPINE FINDINGS  Numbering of the thoracic spine was accomplished by counting down the odontoid.  No thoracic compression fracture or subluxation. Normal cord size and signal throughout. Minor thoracic disc pathology appears noncompressive. No intraspinal mass lesion. No osseous abnormality to suggest infection or tumor. No gross paravertebral mass.  IMPRESSION: Severely motion degraded exam of marginal diagnostic utility.  No areas of cord compression, or worrisome vertebral body lesion.  Lateralizing leftward disc protrusion at C6-7 could affect the LEFT C7 nerve root, as described above.  Central protrusion C5-6 slightly effaces the cord and subarachnoid space but does not appear to be a significant compressive lesion based on non motion degraded sagittal T1-T2 images.   Electronically Signed   By: Davonna Belling M.D.   On: 08/24/2014 18:40     EKG Interpretation   Date/Time:  Tuesday August 24 2014 14:57:33 EDT Ventricular Rate:  88 PR Interval:  166 QRS Duration: 96 QT Interval:   388 QTC Calculation: 469 R Axis:   72 Text Interpretation:  Normal sinus rhythm Possible Left atrial enlargement  ST \\T \  T wave abnormality in lateral leads, present on 08/25/11 Abnormal  ECG Confirmed by Lincoln Brigham [phone]) on 08/24/2014 3:14:37 PM      MDM   Final diagnoses:  Left arm weakness  Unsteady gait  Tremor    Pt is a 58yo female presenting to ED with sudden onset left arm and leg weakness and unsteady gait, concern for CVA. Stroke order set place. Neurology consulted and examined pt. See note from neuro, Dr. Roseanne Reno.   Pt given  ativan prior to going for MRI of cervical and thoracic spine. While in MRI, radiology called stating pt was no longer able to stay still for exam, second dose of  IV ativan ordered.     Reviewed imaging with pt.  Pt states she feels mild fatigue but denies pain.  Pt lying in exam bed, appears well, NAD. No tremor while pt at rest.   Dennie Maizes, RN attempted to ambulate pt, pt unable to ambulate even with assistance as pt started to have another exaggerated tremor in left arm and left leg, unsafe to ambulate pt.    Discussed pt with Dr. Madilyn Hook and reviewed notes from neurology, will admit pt to medicine service for physical therapy and possible psychiatric evaluation.   8:34 PM Consulted with Dr. Algernon Huxley, internal medicine who agreed to come evaluate pt and will place temporary admission orders.  Attending will be Dr. Dalphine Handing.  Pt is stable at this time.      Junius Finner, PA-C 08/24/14 2200  Tilden Fossa, MD 08/24/14 [phone]

## 2014-08-24 NOTE — ED Notes (Signed)
MD at bedside. Dr.rees at bedside

## 2014-08-24 NOTE — Consult Note (Signed)
Referring Physician: Pecola Leisureeese    Chief Complaint: code stroke  HPI:                                                                                                                                         Laurie Contreras is an 59 y.o. female who woke up this AM feeling normal. She was about to go to her best friends funeral and walking to the car with her daughter when she noted both of her legs felt weak.  Her daughter became concerned and noted her legs were very "wobbly" and she was unable to stand on her own.  She was brought to ER.  While in ED code stroke was called.  Patient remained alert and oriented with no facial involvement.  CT brain was obtained showing no acute infarct or bleed. Her main complaint was decreased sensation in her left arm and leg. There was also note of tremor in her left arm but was not noted on exam.  Due to minimal symptoms Code stroke was canceled.   Date last known well: Date: 08/24/2014 Time last known well: Time: 13:15 tPA Given: No: minimal symptoms (sensory) Modified Rankin: Rankin Score=0    History reviewed. No pertinent past medical history.  Past Surgical History  Procedure Laterality Date  . Tonsillectomy      Family History  Problem Relation Age of Onset  . Hypertension Mother   . Stroke Mother   . Heart attack Mother   . Hypertension Father   . Stroke Father    Social History:  reports that she has been smoking.  She does not have any smokeless tobacco history on file. She reports that she drinks about 0.6 oz of alcohol per week. She reports that she does not use illicit drugs.  Allergies: No Known Allergies  Medications:                                                                                                                           Current Facility-Administered Medications  Medication Dose Route Frequency Provider Last Rate Last Dose  . LORazepam (ATIVAN) injection 1 mg  1 mg Intravenous Once Junius FinnerErin O'Malley, PA-C       Current  Outpatient Prescriptions  Medication Sig Dispense Refill  . loratadine (CLARITIN) 10 MG tablet Take 10 mg by mouth daily.  ROS:                                                                                                                                       History obtained from the patient  General ROS: negative for - chills, fatigue, fever, night sweats, weight gain or weight loss Psychological ROS: negative for - behavioral disorder, hallucinations, memory difficulties, mood swings or suicidal ideation Ophthalmic ROS: negative for - blurry vision, double vision, eye pain or loss of vision ENT ROS: negative for - epistaxis, nasal discharge, oral lesions, sore throat, tinnitus or vertigo Allergy and Immunology ROS: negative for - hives or itchy/watery eyes Hematological and Lymphatic ROS: negative for - bleeding problems, bruising or swollen lymph nodes Endocrine ROS: negative for - galactorrhea, hair pattern changes, polydipsia/polyuria or temperature intolerance Respiratory ROS: negative for - cough, hemoptysis, shortness of breath or wheezing Cardiovascular ROS: negative for - chest pain, dyspnea on exertion, edema or irregular heartbeat Gastrointestinal ROS: negative for - abdominal pain, diarrhea, hematemesis, nausea/vomiting or stool incontinence Genito-Urinary ROS: negative for - dysuria, hematuria, incontinence or urinary frequency/urgency Musculoskeletal ROS: negative for - joint swelling or muscular weakness Neurological ROS: as noted in HPI Dermatological ROS: negative for rash and skin lesion changes  Neurologic Examination:                                                                                                      Blood pressure 189/103, pulse 90, temperature 98.3 F (36.8 C), temperature source Oral, resp. rate 16, height 5\' 7"  (1.702 m), weight 81.647 kg (180 lb), SpO2 96 %.  HEENT-  Normocephalic, no lesions, without obvious abnormality.  Normal external  eye and conjunctiva.  Normal TM's bilaterally.  Normal auditory canals and external ears. Normal external nose, mucus membranes and septum.  Normal pharynx. Cardiovascular- S1, S2 normal, pulses palpable throughout   Lungs- chest clear, no wheezing, rales, normal symmetric air entry Abdomen- normal findings: bowel sounds normal Extremities- no edema Lymph-no adenopathy palpable Musculoskeletal-no joint tenderness, deformity or swelling Skin-warm and dry, no hyperpigmentation, vitiligo, or suspicious lesions  Neurological Examination Mental Status: Alert, oriented, thought content appropriate.  Speech fluent without evidence of aphasia.  Able to follow 3 step commands without difficulty. Cranial Nerves: II: Discs flat bilaterally; Visual fields grossly normal, pupils equal, round, reactive to light and accommodation III,IV, VI: ptosis not present, extra-ocular motions intact bilaterally V,VII: smile symmetric, facial light touch sensation normal bilaterally VIII: hearing normal bilaterally IX,X: uvula rises symmetrically  XI: bilateral shoulder shrug XII: midline tongue extension Motor: Right : Upper extremity   5/5    Left:     Upper extremity   5/5 (note)  Lower extremity   5/5     Lower extremity   5/5 (note) --on exam when testing her strength she would at times flail her leg or arm but when not focusing on the limb she showed smooth movements with both limbs.  Tone and bulk:normal tone throughout; no atrophy noted Sensory: Pinprick and light touch decreased on the left arm and leg.  Deep Tendon Reflexes: 2+ and symmetric throughout Plantars: Mute bilaterally Cerebellar: No dysmetria noted on exam Gait: not tested due to multiple leads and safety.        Lab Results: Basic Metabolic Panel:  Recent Labs Lab 08/24/14 1521  NA 144  K 3.3*  CL 104  GLUCOSE 105*  BUN 6  CREATININE 0.70    Liver Function Tests: No results for input(s): AST, ALT, ALKPHOS, BILITOT, PROT,  ALBUMIN in the last 168 hours. No results for input(s): LIPASE, AMYLASE in the last 168 hours. No results for input(s): AMMONIA in the last 168 hours.  CBC:  Recent Labs Lab 08/24/14 1500 08/24/14 1521  NEUTROABS 2.8  --   HGB  --  14.3  HCT  --  42.0    Cardiac Enzymes: No results for input(s): CKTOTAL, CKMB, CKMBINDEX, TROPONINI in the last 168 hours.  Lipid Panel: No results for input(s): CHOL, TRIG, HDL, CHOLHDL, VLDL, LDLCALC in the last 168 hours.  CBG: No results for input(s): GLUCAP in the last 168 hours.  Microbiology: No results found for this or any previous visit.  Coagulation Studies: No results for input(s): LABPROT, INR in the last 72 hours.  Imaging: Ct Head Wo Contrast  08/24/2014   CLINICAL DATA:  Code stroke, leg weakness at 0600 hours, began falling at 1315 hours when trying to walk, fatigue, dizziness  EXAM: CT HEAD WITHOUT CONTRAST  TECHNIQUE: Contiguous axial images were obtained from the base of the skull through the vertex without intravenous contrast.  COMPARISON:  04/15/2006  FINDINGS: Few beam hardening artifacts at skullbase.  Normal ventricular morphology.  No midline shift or mass effect.  Minimal small vessel chronic ischemic changes of deep cerebral white matter.  Otherwise normal appearance of brain parenchyma.  No intracranial hemorrhage, mass lesion or evidence acute infarction.  No extra-axial fluid collections.  Bones and sinuses unremarkable.  IMPRESSION: Minimal small vessel chronic ischemic changes of deep cerebral white matter.  No acute intracranial abnormalities.  Findings called to Dr. Roseanne Reno on 08/24/2014 at 1532 hours.   Electronically Signed   By: Ulyses Southward M.D.   On: 08/24/2014 15:32       Assessment and plan discussed with with attending physician and they are in agreement.    Felicie Morn PA-C [facility] [phone]  08/24/2014, 3:38 PM   Assessment: 59 y.o. female presenting with bilateral Left greater  than right leg weakness and left sided decreased sensation. Initial CT head was negative for acute infarct or bleed. Exam findings appear to be largely functional and inconsistent.   Recommend: 1) MRI cervical and thoracic spine  I personally participated in this patient's evaluation and management, including neurological examination, as well as undulating the above clinical impression and recommendation. Further recommendations will depend on MRI results. If cervical and thoracic MRI findings are unremarkable, no further neurological intervention is indicated. If symptoms persist and patient is unstable with ambulation, recommend  admission to [facility] will physical therapy intervention, and possibly psychiatric evaluation.  Venetia Maxon M.D. [facility] [phone]

## 2014-08-25 DIAGNOSIS — N390 Urinary tract infection, site not specified: Secondary | ICD-10-CM

## 2014-08-25 DIAGNOSIS — R531 Weakness: Principal | ICD-10-CM

## 2014-08-25 DIAGNOSIS — I10 Essential (primary) hypertension: Secondary | ICD-10-CM

## 2014-08-25 DIAGNOSIS — R2681 Unsteadiness on feet: Secondary | ICD-10-CM | POA: Insufficient documentation

## 2014-08-25 DIAGNOSIS — R208 Other disturbances of skin sensation: Secondary | ICD-10-CM

## 2014-08-25 LAB — CBC
HCT: 35.4 % — ABNORMAL LOW (ref 36.0–46.0)
Hemoglobin: 12.4 g/dL (ref 12.0–15.0)
MCH: 30 pg (ref 26.0–34.0)
MCHC: 35 g/dL (ref 30.0–36.0)
MCV: 85.7 fL (ref 78.0–100.0)
Platelets: 229 10*3/uL (ref 150–400)
RBC: 4.13 MIL/uL (ref 3.87–5.11)
RDW: 14.8 % (ref 11.5–15.5)
WBC: 6.8 10*3/uL (ref 4.0–10.5)

## 2014-08-25 LAB — BASIC METABOLIC PANEL
Anion gap: 2 — ABNORMAL LOW (ref 5–15)
Anion gap: 7 (ref 5–15)
BUN: 6 mg/dL (ref 6–23)
BUN: <5 mg/dL — ABNORMAL LOW (ref 6–23)
CO2: 30 mmol/L (ref 19–32)
CO2: 22 mmol/L (ref 19–32)
Calcium: 9.3 mg/dL (ref 8.4–10.5)
Calcium: 9.1 mg/dL (ref 8.4–10.5)
Chloride: 109 mmol/L (ref 96–112)
Chloride: 112 mmol/L (ref 96–112)
Creatinine, Ser: 0.74 mg/dL (ref 0.50–1.10)
Creatinine, Ser: 0.63 mg/dL (ref 0.50–1.10)
GFR calc Af Amer: >90 mL/min (ref >90)
GFR calc Af Amer: >90 mL/min (ref >90)
GFR calc non Af Amer: >90 mL/min (ref >90)
GFR calc non Af Amer: >90 mL/min (ref >90)
Glucose, Bld: 127 mg/dL — ABNORMAL HIGH (ref 70–99)
Glucose, Bld: 86 mg/dL (ref 70–99)
Potassium: 3.1 mmol/L — ABNORMAL LOW (ref 3.5–5.1)
Potassium: 3.8 mmol/L (ref 3.5–5.1)
Sodium: 141 mmol/L (ref 135–145)
Sodium: 141 mmol/L (ref 135–145)

## 2014-08-25 MED ORDER — AMLODIPINE BESYLATE 5 MG PO TABS
5.0000 mg | ORAL_TABLET | Freq: Every day | ORAL | Status: DC
  Administered 2014-08-25: 5 mg via ORAL
  Filled 2014-08-25: qty 1

## 2014-08-25 MED ORDER — AMLODIPINE BESYLATE 5 MG PO TABS: 5.0000 mg | ORAL_TABLET | Freq: Every day | ORAL | Status: DC

## 2014-08-25 MED ORDER — POTASSIUM CHLORIDE CRYS ER 20 MEQ PO TBCR
40.0000 meq | EXTENDED_RELEASE_TABLET | ORAL | Status: AC
  Administered 2014-08-25: 40 meq via ORAL
  Filled 2014-08-25: qty 2

## 2014-08-25 NOTE — Discharge Instructions (Signed)
I want you to start taking Amlodipine 5mg  daily for high blood pressure.  Please follow up in our clinic on the ground floor.

## 2014-08-25 NOTE — Progress Notes (Signed)
INITIAL NUTRITION ASSESSMENT  Pt identified due to Malnutrition Screening Tool  Wt Readings from Last 10 Encounters:  08/24/14 171 lb 4.8 oz (77.7 kg)  08/25/11 185 lb (83.915 kg)   Body mass index is 26.8 kg/(m^2). Pt meets criteria for Overweight based on current BMI.   Current diet order is Heart Healthy, and she has been eating >50% of her meals at this time. Pt denied any wt loss or changes in appetite, and her clothes have been fitting normally. She does not like Ensure Enlive. Will D/C. Labs and medications reviewed.  NFPE did not show any muscle mass or body fat depletion.  No nutrition interventions warranted at this time. If nutrition issues arise, please consult RD.  Laurie FeltElisa Herberta Pickron, MS Dietetic Intern Pager: [phone]

## 2014-08-25 NOTE — Evaluation (Signed)
Physical Therapy Evaluation and DISCHARGE Patient Details Name: Laurie Contreras MRN: [id number] DOB: 01-Aug-1955 Today's Date: 08/25/2014   History of Present Illness  59 y.o.who has not sought medical care in the past admitted with "wobbly legs" complaint. She also reports some left sided transient twitching. She was worked up for stroke and seen by neurology. The work up has been inconsistent with stroke pathology. The patient feels much better this morning. She reports no dizziness and orthostatics are negative. Her physical exam is unremarkable. Her vitals are stable, she is hypertensive  Clinical Impression  Pt has shown to mobilize at a safe level in a home-like environment.  She did display mild incoordination  L LE with fatigue, but no overt deviations or LOB.  Pt is going to her daughter's home for a short period until is appropriately I.  No further PT at this time.  Will sign off.    Follow Up Recommendations No PT follow up;Supervision for mobility/OOB    Equipment Recommendations  None recommended by PT    Recommendations for Other Services       Precautions / Restrictions Precautions Precautions: Fall Restrictions Weight Bearing Restrictions: No      Mobility  Bed Mobility Overal bed mobility: Modified Independent                Transfers Overall transfer level: Needs assistance Equipment used: 1 person hand held assist Transfers: Sit to/from Stand Sit to Stand: Supervision Stand pivot transfers: Min guard       General transfer comment: safe transition  Ambulation/Gait Ambulation/Gait assistance: Supervision Ambulation Distance (Feet): 400 Feet Assistive device: None Gait Pattern/deviations: Step-through pattern Gait velocity: prefers slower, but able to speed up appreciably Gait velocity interpretation: at or above normal speed for age/gender General Gait Details: generally steady and no overt deviation or LOB with some higher level balance tasks.   But did notice some fatigue toward end of session that made her L LE mild more uncoordinated.  Stairs Stairs: Yes Stairs assistance: Supervision Stair Management: One rail Right;Alternating pattern;Forwards Number of Stairs: 6 General stair comments: safe on stairs with rail  Wheelchair Mobility    Modified Rankin (Stroke Patients Only)       Balance Overall balance assessment: Needs assistance Sitting-balance support: Feet supported;No upper extremity supported Sitting balance-Leahy Scale: Normal     Standing balance support: No upper extremity supported Standing balance-Leahy Scale: Fair Standing balance comment: Pt able to maintain static standing balance with supervision but needs min guard to min assist for balance with mobility.               High level balance activites: Backward walking;Direction changes;Sudden stops;Turns;Head turns High Level Balance Comments: no overt instability except when walking backward. Standardized Balance Assessment Standardized Balance Assessment : Dynamic Gait Index   Dynamic Gait Index Level Surface: Normal Change in Gait Speed: Mild Impairment Gait with Horizontal Head Turns: Normal Gait with Vertical Head Turns: Normal Gait and Pivot Turn: Normal Step Over Obstacle: Mild Impairment Step Around Obstacles: Normal Steps: Mild Impairment Total Score: 21       Pertinent Vitals/Pain Pain Assessment: Faces Faces Pain Scale: No hurt    Home Living Family/patient expects to be discharged to:: Private residence Living Arrangements: Children (per friend is going to stay at daughters home.) Available Help at Discharge: Family Type of Home: House Home Access: Stairs to enter   Secretary/administrator of Steps: 1 small step Home Layout: One level Home Equipment: None  Prior Function Level of Independence: Independent               Hand Dominance   Dominant Hand: Right    Extremity/Trunk Assessment   Upper  Extremity Assessment: Overall WFL for tasks assessed           Lower Extremity Assessment: Overall WFL for tasks assessed      Cervical / Trunk Assessment: Normal  Communication   Communication: No difficulties  Cognition Arousal/Alertness: Awake/alert Behavior During Therapy: Agitated;Flat affect Overall Cognitive Status: Within Functional Limits for tasks assessed                      General Comments      Exercises        Assessment/Plan    PT Assessment Patient needs continued PT services  PT Diagnosis Abnormality of gait;Other (comment) (decreased activity tolerance)   PT Problem List Decreased activity tolerance;Decreased balance;Decreased mobility;Decreased coordination  PT Treatment Interventions Gait training;Stair training;Functional mobility training;Therapeutic activities;Balance training;Cognitive remediation   PT Goals (Current goals can be found in the Care Plan section) Acute Rehab PT Goals PT Goal Formulation: All assessment and education complete, DC therapy    Frequency Min 3X/week   Barriers to discharge        Co-evaluation               End of Session   Activity Tolerance: Patient tolerated treatment well Patient left: in bed;with call bell/phone within reach;with family/visitor present Nurse Communication: Mobility status    Functional Assessment Tool Used: clinical judgement Functional Limitation: Mobility: Walking and moving around Mobility: Walking and Moving Around Current Status ([id number](G8978): At least 1 percent but less than 20 percent impaired, limited or restricted Mobility: Walking and Moving Around Goal Status [phone](G8979): At least 1 percent but less than 20 percent impaired, limited or restricted Mobility: Walking and Moving Around Discharge Status [phone](G8980): At least 1 percent but less than 20 percent impaired, limited or restricted    Time: 1530-1550 PT Time Calculation (min) (ACUTE ONLY): 20 min   Charges:   PT  Evaluation $Initial PT Evaluation Tier I: 1 Procedure     PT G Codes:   PT G-Codes **NOT FOR INPATIENT CLASS** Functional Assessment Tool Used: clinical judgement Functional Limitation: Mobility: Walking and moving around Mobility: Walking and Moving Around Current Status ([id number](G8978): At least 1 percent but less than 20 percent impaired, limited or restricted Mobility: Walking and Moving Around Goal Status [phone](G8979): At least 1 percent but less than 20 percent impaired, limited or restricted Mobility: Walking and Moving Around Discharge Status [phone](G8980): At least 1 percent but less than 20 percent impaired, limited or restricted    Valeta Paz, Eliseo GumKenneth V 08/25/2014, 5:01 PM 08/25/2014  Tilden BingKen Micalah Cabezas, PT [phone]  (pager)

## 2014-08-25 NOTE — Progress Notes (Signed)
Patient discharge teaching given, including activity, diet, follow-up appoints, and medications. Patient verbalized understanding of all discharge instructions. IV access was d/c'd. Vitals are stable. Skin is intact except as charted in most recent assessments. Pt escorted out by RN, to be driven home by family.  

## 2014-08-25 NOTE — Evaluation (Signed)
Occupational Therapy Evaluation Patient Details Name: Laurie HansonWanda F Rehmann MRN: [id number] DOB: 06/29/1955 Today's Date: 08/25/2014    History of Present Illness 59 y.o.who has not sought medical care in the past admitted with "wobbly legs" complaint. She also reports some left sided transient twitching. She was worked up for stroke and seen by neurology. The work up has been inconsistent with stroke pathology. The patient feels much better this morning. She reports no dizziness and orthostatics are negative. Her physical exam is unremarkable. Her vitals are stable, she is hypertensive   Clinical Impression   Pt overall supervision to min assist level for selfcare tasks.  Pt with inconsistent LLE weakness with mobility and standing that varied without any physical implication.  Pt supervision at one point when ambulating down the hall with normal gait pattern and then min to min guard with exaggerated symptoms of LLE weakness.  Overall feel she will need initial 24 hour supervision for safety.  No indication for continued OT services but do feel pt will need 3:1 and shower seat for safety.  Family will discuss and alert team if they want to pursue it.      Follow Up Recommendations  Supervision/Assistance - 24 hour    Equipment Recommendations  Other (comment) (may need 3:1 and shower seat but family will decide)       Precautions / Restrictions Precautions Precautions: Fall Restrictions Weight Bearing Restrictions: No      Mobility Bed Mobility Overal bed mobility: Modified Independent                Transfers Overall transfer level: Needs assistance Equipment used: 1 person hand held assist Transfers: Sit to/from Stand;Stand Pivot Transfers Sit to Stand: Min guard Stand pivot transfers: Min guard            Balance Overall balance assessment: Needs assistance   Sitting balance-Leahy Scale: Normal       Standing balance-Leahy Scale: Fair Standing balance comment:  Pt able to maintain static standing balance with supervision but needs min guard to min assist for balance with mobility.                              ADL Overall ADL's : Needs assistance/impaired Eating/Feeding: Independent;Sitting   Grooming: Min guard;Standing   Upper Body Bathing: Set up;Sitting   Lower Body Bathing: Min guard;Sit to/from stand   Upper Body Dressing : Set up;Sitting   Lower Body Dressing: Min guard;Sit to/from stand   Toilet Transfer: Minimal assistance;Ambulation Toilet Transfer Details (indicate cue type and reason): Simulated without assistive device.  Pt declined need to toilet. Toileting- ArchitectClothing Manipulation and Hygiene: Min guard;Sit to/from stand   Tub/ Shower Transfer: Tub transfer;Ambulation;Min guard   Functional mobility during ADLs: Minimal assistance General ADL Comments: Pt initially able to ambulate with normal gait pattern but did reach for items to help stabilize her balance.  As she walked further she was able to negotiate stepping over objects without UE support and no LOB noted.  Pt with increased difficulty coordinating head movements with mobility and would stop walking when asked to identify the number of fingers that therapist was holding up.  After a minute or so pts ambulation began to slow and her left leg began having trouble advancing smoothly.  She stopped and then proceeded with normal gait pattern saying to therapist "This is how i usually walk, not like that."  Pt's symptoms would continue to come back  after walking a few feet and then in static standing she would exhibit increased left knee buckling stating again " My right leg feels fine but my left leg is not." ambulated back to room with pt's daughters friend and another friend present. Discussed need for pt to have a 3:1 and shower seat at home currently for safety.  Also feel pt needs 24 hour supervision as well.       Vision Vision Assessment?: No apparent visual  deficits   Perception Perception Perception Tested?: No   Praxis Praxis Praxis tested?: Not tested    Pertinent Vitals/Pain Pain Assessment: No/denies pain     Hand Dominance Right   Extremity/Trunk Assessment Upper Extremity Assessment Upper Extremity Assessment: Overall WFL for tasks assessed   Lower Extremity Assessment Lower Extremity Assessment: Defer to PT evaluation   Cervical / Trunk Assessment Cervical / Trunk Assessment: Normal   Communication Communication Communication: No difficulties   Cognition Arousal/Alertness: Awake/alert Behavior During Therapy: Anxious;Agitated (Pt pleasant but disagreeing with family friend on home vs daughters house) Overall Cognitive Status: Within Functional Limits for tasks assessed                                Home Living Family/patient expects to be discharged to:: Private residence Living Arrangements: Children (Going to daughter's house)   Type of Home: House Home Access: Stairs to enter Secretary/administrator of Steps: 1 small step   Home Layout: One level     Bathroom Shower/Tub: Chief Strategy Officer: Standard Bathroom Accessibility: Yes   Home Equipment: None          Prior Functioning/Environment Level of Independence: Independent             OT Diagnosis: Generalized weakness;Altered mental status                          End of Session Equipment Utilized During Treatment: Gait belt Nurse Communication: Mobility status  Activity Tolerance: Patient tolerated treatment well Patient left: in bed;with call bell/phone within reach;with family/visitor present   Time: 1610-9604 OT Time Calculation (min): 26 min Charges:  OT General Charges $OT Visit: 1 Procedure OT Evaluation $Initial OT Evaluation Tier I: 1 Procedure OT Treatments $Self Care/Home Management : 8-22 mins G-Codes: OT G-codes **NOT FOR INPATIENT CLASS** Functional Assessment Tool Used: clinical  judgement Functional Limitation: Self care Self Care Current Status (V4098): At least 1 percent but less than 20 percent impaired, limited or restricted Self Care Goal Status (J1914): At least 1 percent but less than 20 percent impaired, limited or restricted Self Care Discharge Status [phone]): At least 1 percent but less than 20 percent impaired, limited or restricted  Cormick Moss OTR/L 08/25/2014, 3:02 PM

## 2014-08-25 NOTE — Progress Notes (Addendum)
Subjective: Pt states she feels better, however not completely back at baseline. Had to use a walker to ambulate to restroom.   Objective: Vital signs in last 24 hours: Filed Vitals:   08/24/14 2326 08/24/14 2328 08/24/14 2330 08/25/14 0539  BP: 172/123 192/101 165/101 164/72  Pulse: 83 91 96 78  Temp:    98.4 F (36.9 C)  TempSrc:    Oral  Resp:    18  Height:      Weight:      SpO2: 99% 97% 97% 92%   Weight change:   Intake/Output Summary (Last 24 hours) at 08/25/14 0855 Last data filed at 08/25/14 0615  Gross per 24 hour  Intake      0 ml  Output   1200 ml  Net  -1200 ml   General: NAD, laying in bed comfortably HEENT: non tender to c spine palpation, full range of motion of neck Lungs: CTAB, no wheezing Cardiac: RRR, no murmurs GI: soft, active bowel sounds, non TTP Neuro: CN II-XII grossly intact, moving all extremities. LLE 4/5 strength, RLE 5/5 strength  Lab Results: Basic Metabolic Panel:  Recent Labs Lab 08/25/14 0001 08/25/14 0713  NA 141 141  K 3.1* 3.8  CL 109 112  CO2 30 22  GLUCOSE 127* 86  BUN 6 <5*  CREATININE 0.74 0.63  CALCIUM 9.3 9.1   CBC:  Recent Labs Lab 08/24/14 1500 08/24/14 1521 08/25/14 0001  WBC  --   --  6.8  NEUTROABS 2.8  --   --   HGB  --  14.3 12.4  HCT  --  42.0 35.4*  MCV  --   --  85.7  PLT  --   --  229   Coagulation:  Recent Labs Lab 08/24/14 1500  LABPROT 14.2  INR 1.09   Urine Drug Screen: Drugs of Abuse     Component Value Date/Time   LABOPIA NONE DETECTED 08/24/2014 1558   COCAINSCRNUR NONE DETECTED 08/24/2014 1558   LABBENZ NONE DETECTED 08/24/2014 1558   AMPHETMU NONE DETECTED 08/24/2014 1558   THCU NONE DETECTED 08/24/2014 1558   LABBARB NONE DETECTED 08/24/2014 1558    Alcohol Level:  Recent Labs Lab 08/24/14 1500  ETH <5   Urinalysis:  Recent Labs Lab 08/24/14 1558  COLORURINE YELLOW  LABSPEC 1.005  PHURINE 7.0  GLUCOSEU NEGATIVE  HGBUR TRACE*  BILIRUBINUR NEGATIVE    KETONESUR NEGATIVE  PROTEINUR NEGATIVE  UROBILINOGEN 0.2  NITRITE NEGATIVE  LEUKOCYTESUR SMALL*  Studies/Results: Dg Chest 2 View  08/24/2014   CLINICAL DATA:  Fatigue. Dizziness. Weakness. Symptoms started today.  EXAM: CHEST  2 VIEW  COMPARISON:  08/25/2011  FINDINGS: Mild enlargement of the cardiopericardial silhouette, without edema. Old granulomatous disease. The lungs appear otherwise clear. No pleural effusion.  IMPRESSION: 1. Mild enlargement of the cardiopericardial silhouette, without edema.   Electronically Signed   By: Gaylyn Rong M.D.   On: 08/24/2014 19:37   Ct Head Wo Contrast  08/24/2014   CLINICAL DATA:  Code stroke, leg weakness at 0600 hours, began falling at 1315 hours when trying to walk, fatigue, dizziness  EXAM: CT HEAD WITHOUT CONTRAST  TECHNIQUE: Contiguous axial images were obtained from the base of the skull through the vertex without intravenous contrast.  COMPARISON:  04/15/2006  FINDINGS: Few beam hardening artifacts at skullbase.  Normal ventricular morphology.  No midline shift or mass effect.  Minimal small vessel chronic ischemic changes of deep cerebral white matter.  Otherwise normal appearance  of brain parenchyma.  No intracranial hemorrhage, mass lesion or evidence acute infarction.  No extra-axial fluid collections.  Bones and sinuses unremarkable.  IMPRESSION: Minimal small vessel chronic ischemic changes of deep cerebral white matter.  No acute intracranial abnormalities.  Findings called to Dr. Roseanne RenoStewart on 08/24/2014 at 1532 hours.   Electronically Signed   By: Ulyses SouthwardMark  Boles M.D.   On: 08/24/2014 15:32   Mr Cervical Spine Wo Contrast  08/24/2014   CLINICAL DATA:  Patient complains of LEFT-sided weakness after attending a funeral. Inconsistent neurologic exam.  EXAM: MRI CERVICAL AND THORACIC SPINE WITHOUT CONTRAST  TECHNIQUE: Multiplanar and multiecho pulse sequences of the cervical spine, to include the craniocervical junction and cervicothoracic  junction, and lumbar spine, were obtained without intravenous contrast.  COMPARISON:  None.  FINDINGS: Examination is severely motion degraded. The patient had to be removed from the scanner multiple times. Study is diagnostic to exclude cord compression.  MRI CERVICAL SPINE FINDINGS  Normal cord size and signal throughout. No tonsillar herniation. No intraspinal mass lesion or cord hyperintensity. No osseous lesion.  Intervertebral disc spaces are preserved. Anatomic alignment. No craniocervical junction anomaly.  Axial images:  -Shallow protrusion at C4-5 is noncompressive.  -Slightly larger protrusion at C5-6 effaces the anterior subarachnoid space and mildly flattens the cord. This appearance is less impressive on sagittal T1 and T2 imaging however. Sagittal diameter spinal canal measures at least 7 mm on the non degraded sagittal images.  -Shallow protrusion at C6-7 is eccentric to the LEFT and could affect the LEFT C7 nerve root.  MRI THORACIC SPINE FINDINGS  Numbering of the thoracic spine was accomplished by counting down the odontoid.  No thoracic compression fracture or subluxation. Normal cord size and signal throughout. Minor thoracic disc pathology appears noncompressive. No intraspinal mass lesion. No osseous abnormality to suggest infection or tumor. No gross paravertebral mass.  IMPRESSION: Severely motion degraded exam of marginal diagnostic utility.  No areas of cord compression, or worrisome vertebral body lesion.  Lateralizing leftward disc protrusion at C6-7 could affect the LEFT C7 nerve root, as described above.  Central protrusion C5-6 slightly effaces the cord and subarachnoid space but does not appear to be a significant compressive lesion based on non motion degraded sagittal T1-T2 images.   Electronically Signed   By: Davonna BellingJohn  Curnes M.D.   On: 08/24/2014 18:40   Mr Thoracic Spine Wo Contrast  08/24/2014   CLINICAL DATA:  Patient complains of LEFT-sided weakness after attending a funeral.  Inconsistent neurologic exam.  EXAM: MRI CERVICAL AND THORACIC SPINE WITHOUT CONTRAST  TECHNIQUE: Multiplanar and multiecho pulse sequences of the cervical spine, to include the craniocervical junction and cervicothoracic junction, and lumbar spine, were obtained without intravenous contrast.  COMPARISON:  None.  FINDINGS: Examination is severely motion degraded. The patient had to be removed from the scanner multiple times. Study is diagnostic to exclude cord compression.  MRI CERVICAL SPINE FINDINGS  Normal cord size and signal throughout. No tonsillar herniation. No intraspinal mass lesion or cord hyperintensity. No osseous lesion.  Intervertebral disc spaces are preserved. Anatomic alignment. No craniocervical junction anomaly.  Axial images:  -Shallow protrusion at C4-5 is noncompressive.  -Slightly larger protrusion at C5-6 effaces the anterior subarachnoid space and mildly flattens the cord. This appearance is less impressive on sagittal T1 and T2 imaging however. Sagittal diameter spinal canal measures at least 7 mm on the non degraded sagittal images.  -Shallow protrusion at C6-7 is eccentric to the LEFT and could affect the  LEFT C7 nerve root.  MRI THORACIC SPINE FINDINGS  Numbering of the thoracic spine was accomplished by counting down the odontoid.  No thoracic compression fracture or subluxation. Normal cord size and signal throughout. Minor thoracic disc pathology appears noncompressive. No intraspinal mass lesion. No osseous abnormality to suggest infection or tumor. No gross paravertebral mass.  IMPRESSION: Severely motion degraded exam of marginal diagnostic utility.  No areas of cord compression, or worrisome vertebral body lesion.  Lateralizing leftward disc protrusion at C6-7 could affect the LEFT C7 nerve root, as described above.  Central protrusion C5-6 slightly effaces the cord and subarachnoid space but does not appear to be a significant compressive lesion based on non motion degraded  sagittal T1-T2 images.   Electronically Signed   By: Davonna Belling M.D.   On: 08/24/2014 18:40   Medications: I have reviewed the patient's current medications. Scheduled Meds: . aspirin EC  81 mg Oral Daily  . feeding supplement (ENSURE ENLIVE)  237 mL Oral BID BM  . heparin  5,000 Units Subcutaneous 3 times per day   Continuous Infusions: . sodium chloride 100 mL/hr at 08/24/14 2325   PRN Meds:. Assessment/Plan: Active Problems:   Lower extremity weakness   Left arm weakness   Tremor   Elevated blood pressure   Unsteady gait   Left leg weakness with decreased sensation: Likely related to acute stress. Orthostatics negative. Neurology following, CT of head negative, MRI of c spine and thoracic spine neg except for lateralizing leftward disc protrusion at C6-7 could affect the LEFT C7 nerve root. Believe weakness functional. Symptoms have improved today on exam.  -aspirin 81 mg - PT/OT consulted  Hypertensive urgency: Initial blood pressure 189/103 in the emergency department. Now 164/72 this morning - starting norvasc   Bacteriuria: UA notable for many bacteria, small leukocytes though small squamous cells suggests contamination. Pt w/o symptoms, will cont to monitor.   #FEN:  -Diet: Heart healthy  #DVT prophylaxis: Heparin  #CODE STATUS: DNR/DNI -Defer to daughter Dennis Bast was recently made the healthcare power of attorney] Ladene Artist [phone] if patients lacks decision-making capacity -Confirmed with patient on admission   Dispo: discharge home today   The patient does not have a current PCP (No primary care provider on file.) and does need an [facility] hospital follow-up appointment after discharge.  The patient does not have transportation limitations that hinder transportation to clinic appointments.  .Services Needed at time of discharge: Y = Yes, Blank = No PT:   OT:   RN:   Equipment:   Other:       Denton Brick, MD 08/25/2014, 8:55 AM

## 2014-08-25 NOTE — Progress Notes (Signed)
UR completed 

## 2014-08-25 NOTE — Discharge Summary (Signed)
Name: Laurie Contreras MRN: [id number] DOB: November 29, 1955 59 y.o. PCP: No primary care provider on file.  Date of Admission: 08/24/2014  3:10 PM Date of Discharge: 08/25/2014 Attending Physician: Aletta Edouard, MD  Discharge Diagnosis: Active Problems:   Lower extremity weakness   Left arm weakness   Tremor   Elevated blood pressure   Unsteady gait  Discharge Medications:   Medication List    ASK your doctor about these medications        loratadine 10 MG tablet  Commonly known as:  CLARITIN  Take 10 mg by mouth daily as needed for allergies.        Disposition and follow-up:   [name] was discharged from [facility] in Stable condition.  At the hospital follow up visit please address:  1.  Please ensure patient has not had any further weakness symptoms, neurology felt symptoms were functional. Could consider referral to psychiatry.   2.  Labs / imaging needed at time of follow-up: none  3.  Pending labs/ test needing follow-up: none  Follow-up Appointments:     Follow-up Information    Follow up with [facility] On 09/08/2014.   Why:  at 9:45am for hospital follow-up   Contact information:   [address] [place] [address] [phone]      Discharge Instructions:   Consultations:    Procedures Performed:  Dg Chest 2 View  08/24/2014   CLINICAL DATA:  Fatigue. Dizziness. Weakness. Symptoms started today.  EXAM: CHEST  2 VIEW  COMPARISON:  08/25/2011  FINDINGS: Mild enlargement of the cardiopericardial silhouette, without edema. Old granulomatous disease. The lungs appear otherwise clear. No pleural effusion.  IMPRESSION: 1. Mild enlargement of the cardiopericardial silhouette, without edema.   Electronically Signed   By: Gaylyn Rong M.D.   On: 08/24/2014 19:37   Ct Head Wo Contrast  08/24/2014   CLINICAL DATA:  Code stroke, leg weakness at 0600 hours, began falling at 1315 hours when  trying to walk, fatigue, dizziness  EXAM: CT HEAD WITHOUT CONTRAST  TECHNIQUE: Contiguous axial images were obtained from the base of the skull through the vertex without intravenous contrast.  COMPARISON:  04/15/2006  FINDINGS: Few beam hardening artifacts at skullbase.  Normal ventricular morphology.  No midline shift or mass effect.  Minimal small vessel chronic ischemic changes of deep cerebral white matter.  Otherwise normal appearance of brain parenchyma.  No intracranial hemorrhage, mass lesion or evidence acute infarction.  No extra-axial fluid collections.  Bones and sinuses unremarkable.  IMPRESSION: Minimal small vessel chronic ischemic changes of deep cerebral white matter.  No acute intracranial abnormalities.  Findings called to Dr. Roseanne Reno on 08/24/2014 at 1532 hours.   Electronically Signed   By: Ulyses Southward M.D.   On: 08/24/2014 15:32   Mr Cervical Spine Wo Contrast  08/24/2014   CLINICAL DATA:  Patient complains of LEFT-sided weakness after attending a funeral. Inconsistent neurologic exam.  EXAM: MRI CERVICAL AND THORACIC SPINE WITHOUT CONTRAST  TECHNIQUE: Multiplanar and multiecho pulse sequences of the cervical spine, to include the craniocervical junction and cervicothoracic junction, and lumbar spine, were obtained without intravenous contrast.  COMPARISON:  None.  FINDINGS: Examination is severely motion degraded. The patient had to be removed from the scanner multiple times. Study is diagnostic to exclude cord compression.  MRI CERVICAL SPINE FINDINGS  Normal cord size and signal throughout. No tonsillar herniation. No intraspinal mass lesion or cord hyperintensity. No  osseous lesion.  Intervertebral disc spaces are preserved. Anatomic alignment. No craniocervical junction anomaly.  Axial images:  -Shallow protrusion at C4-5 is noncompressive.  -Slightly larger protrusion at C5-6 effaces the anterior subarachnoid space and mildly flattens the cord. This appearance is less impressive on  sagittal T1 and T2 imaging however. Sagittal diameter spinal canal measures at least 7 mm on the non degraded sagittal images.  -Shallow protrusion at C6-7 is eccentric to the LEFT and could affect the LEFT C7 nerve root.  MRI THORACIC SPINE FINDINGS  Numbering of the thoracic spine was accomplished by counting down the odontoid.  No thoracic compression fracture or subluxation. Normal cord size and signal throughout. Minor thoracic disc pathology appears noncompressive. No intraspinal mass lesion. No osseous abnormality to suggest infection or tumor. No gross paravertebral mass.  IMPRESSION: Severely motion degraded exam of marginal diagnostic utility.  No areas of cord compression, or worrisome vertebral body lesion.  Lateralizing leftward disc protrusion at C6-7 could affect the LEFT C7 nerve root, as described above.  Central protrusion C5-6 slightly effaces the cord and subarachnoid space but does not appear to be a significant compressive lesion based on non motion degraded sagittal T1-T2 images.   Electronically Signed   By: Davonna Belling M.D.   On: 08/24/2014 18:40   Mr Thoracic Spine Wo Contrast  08/24/2014   CLINICAL DATA:  Patient complains of LEFT-sided weakness after attending a funeral. Inconsistent neurologic exam.  EXAM: MRI CERVICAL AND THORACIC SPINE WITHOUT CONTRAST  TECHNIQUE: Multiplanar and multiecho pulse sequences of the cervical spine, to include the craniocervical junction and cervicothoracic junction, and lumbar spine, were obtained without intravenous contrast.  COMPARISON:  None.  FINDINGS: Examination is severely motion degraded. The patient had to be removed from the scanner multiple times. Study is diagnostic to exclude cord compression.  MRI CERVICAL SPINE FINDINGS  Normal cord size and signal throughout. No tonsillar herniation. No intraspinal mass lesion or cord hyperintensity. No osseous lesion.  Intervertebral disc spaces are preserved. Anatomic alignment. No craniocervical  junction anomaly.  Axial images:  -Shallow protrusion at C4-5 is noncompressive.  -Slightly larger protrusion at C5-6 effaces the anterior subarachnoid space and mildly flattens the cord. This appearance is less impressive on sagittal T1 and T2 imaging however. Sagittal diameter spinal canal measures at least 7 mm on the non degraded sagittal images.  -Shallow protrusion at C6-7 is eccentric to the LEFT and could affect the LEFT C7 nerve root.  MRI THORACIC SPINE FINDINGS  Numbering of the thoracic spine was accomplished by counting down the odontoid.  No thoracic compression fracture or subluxation. Normal cord size and signal throughout. Minor thoracic disc pathology appears noncompressive. No intraspinal mass lesion. No osseous abnormality to suggest infection or tumor. No gross paravertebral mass.  IMPRESSION: Severely motion degraded exam of marginal diagnostic utility.  No areas of cord compression, or worrisome vertebral body lesion.  Lateralizing leftward disc protrusion at C6-7 could affect the LEFT C7 nerve root, as described above.  Central protrusion C5-6 slightly effaces the cord and subarachnoid space but does not appear to be a significant compressive lesion based on non motion degraded sagittal T1-T2 images.   Electronically Signed   By: Davonna Belling M.D.   On: 08/24/2014 18:40     Admission HPI: Ms. Liles is a 59 year old female with no known past medical history who presents to the ED with chief complaint "wobbly legs." Her daughter and son are present in the room and contributed interview.  Around  1 PM, she reports being at home and getting ready for work when she noticed that her legs became wobbly which she describes as "legs and feet were not in the shoe." She describes that the symptoms persisted for roughly an hour. Her daughter reported that as she saw her mother leaving the house, she was walking "funny" to the car. They were both going to visit the grave of the patient's mother as  today was her birthday. Upon arriving at the flower store, the daughter noted that as the mother was attempting to get out of the car, she also had gait abnormality similar to when she saw her leaving the house. At that point, the daughter went to support her mother and caught her from falling. She then brought her straight to the emergency department , and in triage, her mother developed twitchiness of her left upper and lower extremity which persisted for no more than 10 minutes. The mother also reports that she had similar symptoms in the car ride to the daughter was not able to see them. Of note, the patient's best friend was taken off of life support and passed away earlier today as well. The daughter reports that the patient has been under stress over the last 7 days given the anniversary of the death of the daughter's husband. During all of these events, the patient denies any loss of consciousness. She denies any recent alcohol or illicit drug use though smokes half a pack per day since the age of 59. Aside for a history of intermittent headache which she describes as a "throbbing sensation in the back of her head," she denies any dizziness, numbness/tingling, shortness of breath, chest pain, abdominal pain, nausea, vomiting, diarrhea, recent poor appetite, prior history of depression or anxiety, active suicidal or homicidal ideation; daughter also called the patient was verbalizing coherently and laughing in the car ride without any signs of facial droop or asymmetry. She lives at home by herself and works at a liquor store. In the emergency department, initial head CT unremarkable for acute infarct or bleed. Neurology was consulted and recommended cervical and thoracic spine MRI which were notable for lateralizing leftward disc protrusion at C6/C7 with possible involvement of left C7 nerve root and central protrusion at C5/C6.   Hospital Course by problem list: Active Problems:   Lower extremity  weakness   Left arm weakness   Tremor   Elevated blood pressure   Unsteady gait   Left leg weakness with decreased sensation: Likely related to acute stress. Imaging and physical exam findings do not correlate with a neurologic process, like CVA. Head CT negative. As she was able to recall the details of these events, syncope and seizures are also less likely. Toxin ingestion less likely as UDS and serum ethanol were both negative. Aside from mild hypokalemia noted on ED labs, there are no other electrolyte disturbances to account for her current symptoms. Mild EKG changes though initial troponin negative in the ED and absence of cardiac symptoms makes ACS less likely as well. Certainly, psychiatric diagnoses are one of exclusion which appears to be the case here though she does not have a prior history of psychiatric illness or active homicidal/suicidal ideation which is otherwise reassuring. Neurology saw patient and recommended MRI of c spine and thoracic spine, also noted largely functional and inconsistent physical exam. MRI of c spine and thoracic spine neg except for lateralizing leftward disc protrusion at C6-7 could affect the LEFT C7 nerve root. Symptoms improved  the next day of admission. PT and OT saw patient and did not recommend further PT/OT treatment.   Hypertensive urgency: Initial blood pressure 189/103 in the emergency department. She does not take any regular medications at home and is likely elevated at baseline. She was started on norvasc 5mg  daily.   Bacteriuria: UA notable for many bacteria, small leukocytes though small squamous cells suggests contamination. She is without symptoms. Can repeat UA as outpatient if indicated. Was not treated w/ abx.    Discharge Vitals:   BP 166/78 mmHg  Pulse 73  Temp(Src) 98.6 F (37 C) (Oral)  Resp 18  Ht 5\' 7"  (1.702 m)  Wt 171 lb 4.8 oz (77.7 kg)  BMI 26.82 kg/m2  SpO2 96%  Discharge Labs:  Results for orders placed or performed  during the hospital encounter of 08/24/14 (from the past 24 hour(s))  CBC     Status: Abnormal   Collection Time: 08/25/14 12:01 AM  Result Value Ref Range   WBC 6.8 4.0 - 10.5 K/uL   RBC 4.13 3.87 - 5.11 MIL/uL   Hemoglobin 12.4 12.0 - 15.0 g/dL   HCT 16.1 (L) 09.6 - 04.5 %   MCV 85.7 78.0 - 100.0 fL   MCH 30.0 26.0 - 34.0 pg   MCHC 35.0 30.0 - 36.0 g/dL   RDW 40.9 81.1 - 91.4 %   Platelets 229 150 - 400 K/uL  Basic metabolic panel     Status: Abnormal   Collection Time: 08/25/14 12:01 AM  Result Value Ref Range   Sodium 141 135 - 145 mmol/L   Potassium 3.1 (L) 3.5 - 5.1 mmol/L   Chloride 109 96 - 112 mmol/L   CO2 30 19 - 32 mmol/L   Glucose, Bld 127 (H) 70 - 99 mg/dL   BUN 6 6 - 23 mg/dL   Creatinine, Ser 7.82 0.50 - 1.10 mg/dL   Calcium 9.3 8.4 - 95.6 mg/dL   GFR calc non Af Amer >90 >90 mL/min   GFR calc Af Amer >90 >90 mL/min   Anion gap 2 (L) 5 - 15  Basic metabolic panel     Status: Abnormal   Collection Time: 08/25/14  7:13 AM  Result Value Ref Range   Sodium 141 135 - 145 mmol/L   Potassium 3.8 3.5 - 5.1 mmol/L   Chloride 112 96 - 112 mmol/L   CO2 22 19 - 32 mmol/L   Glucose, Bld 86 70 - 99 mg/dL   BUN <5 (L) 6 - 23 mg/dL   Creatinine, Ser 2.13 0.50 - 1.10 mg/dL   Calcium 9.1 8.4 - 08.6 mg/dL   GFR calc non Af Amer >90 >90 mL/min   GFR calc Af Amer >90 >90 mL/min   Anion gap 7 5 - 15    Signed: Denton Brick, MD 08/25/2014, 4:59 PM    Services Ordered on Discharge: none Equipment Ordered on Discharge: none

## 2014-09-07 ENCOUNTER — Telehealth: Payer: Self-pay | Admitting: Internal Medicine

## 2014-09-07 NOTE — Telephone Encounter (Signed)
Call to patient to confirm appointment for 09/08/14 at 9:45 lmtcb ° °

## 2014-09-08 ENCOUNTER — Ambulatory Visit (INDEPENDENT_AMBULATORY_CARE_PROVIDER_SITE_OTHER): Payer: Self-pay | Admitting: Internal Medicine

## 2014-09-08 ENCOUNTER — Encounter: Payer: Self-pay | Admitting: Internal Medicine

## 2014-09-08 VITALS — BP 187/93 | HR 91 | Temp 98.2°F | Ht 67.0 in | Wt 176.3 lb

## 2014-09-08 DIAGNOSIS — R29898 Other symptoms and signs involving the musculoskeletal system: Secondary | ICD-10-CM

## 2014-09-08 DIAGNOSIS — Z720 Tobacco use: Secondary | ICD-10-CM | POA: Insufficient documentation

## 2014-09-08 DIAGNOSIS — I10 Essential (primary) hypertension: Secondary | ICD-10-CM

## 2014-09-08 HISTORY — DX: Tobacco use: Z72.0

## 2014-09-08 LAB — BASIC METABOLIC PANEL WITH GFR
BUN: 9 mg/dL (ref 6–23)
CO2: 25 mEq/L (ref 19–32)
Calcium: 10 mg/dL (ref 8.4–10.5)
Chloride: 106 mEq/L (ref 96–112)
Creat: 0.56 mg/dL (ref 0.50–1.10)
GFR, Est African American: >89 mL/min
GFR, Est Non African American: >89 mL/min
Glucose, Bld: 93 mg/dL (ref 70–99)
Potassium: 4.1 mEq/L (ref 3.5–5.3)
Sodium: 141 mEq/L (ref 135–145)

## 2014-09-08 MED ORDER — NICOTINE 7 MG/24HR TD PT24: 7.0000 mg | MEDICATED_PATCH | TRANSDERMAL | Status: DC

## 2014-09-08 MED ORDER — AMLODIPINE BESYLATE 5 MG PO TABS: 5.0000 mg | ORAL_TABLET | Freq: Every day | ORAL | Status: DC

## 2014-09-08 NOTE — Assessment & Plan Note (Signed)
Completely resolved. We reviewed the reports of her MRI brain and C spine together.  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

## 2014-09-08 NOTE — Assessment & Plan Note (Addendum)
BP Readings from Last 3 Encounters:  09/08/14 187/93  08/25/14 166/78  08/26/11 154/76    Lab Results  Component Value Date   NA 141 08/25/2014   K 3.8 08/25/2014   CREATININE 0.63 08/25/2014    Assessment: Blood pressure control:  uncontrolled Progress toward BP goal:    unchanged Comments: taking amlodipine brings package to clinic  Plan: Medications:  Amlodipine 5mg  daily Educational resources provided:   Self management tools provided:   Other plans: check BMP today, if hyopkalemic will eval for secondary cause of hyperaldosteronism, if potassium is normal will start HCTZ at 25mg  daily and see back in 1 month.  ADDENDUM: Potassium wnl will start HCTZ 25 mg daily.

## 2014-09-08 NOTE — Assessment & Plan Note (Signed)
We discussed her smoking and the adverse health effects associated with it including heart diease, she is currently smoking 1/2 ppd, has tried to quit cold [place] once before.  She wants to and is ready to quit and would like assistance.   - Nicotine patch 7mcg daily for 2 weeks given 1 refill if needed.

## 2014-09-08 NOTE — Patient Instructions (Signed)
General Instructions: Try to take in less salt limit it to 2g a day.  I will call with the lab work and may send you a prescription for hydrochlorothiazide (blood pressure pill).  I sent you a prescription for nicotine patches.  Please bring your medicines with you each time you come to clinic.  Medicines may include prescription medications, over-the-counter medications, herbal remedies, eye drops, vitamins, or other pills.   Progress Toward Treatment Goals:  Treatment Goal 09/08/2014  Stop smoking smoking the same amount    Self Care Goals & Plans:  Self Care Goal 09/08/2014  Manage my medications take my medicines as prescribed; bring my medications to every visit; refill my medications on time; follow the sick day instructions if I am sick  Monitor my health keep track of my weight  Eat healthy foods eat more vegetables; eat fruit for snacks and desserts; eat baked foods instead of fried foods; eat foods that are low in salt; eat smaller portions  Be physically active find an activity I enjoy    No flowsheet data found.   Care Management & Community Referrals:  No flowsheet data found.

## 2014-09-08 NOTE — Progress Notes (Signed)
[facility] Subjective:   Patient ID: Laurie Contreras female   DOB: 03/17/1956 59 y.o.   MRN: [id number]  HPI: Laurie Contreras is a 59 y.o. female with a PMH detailed below who presents for HFU after an episdoe of lower extremity weakness that happended which she was going to a funeral.  Neurology was consulted in the hospital and an extensive workup was negative for acute stroke and it was felt to likely be an acute grief reaction.  She was evaluated by PT and OT who found no acute needs.  She reports she is feeling very well today and has had no return of her symptoms.  She want to go back to work.  HTN: Started on amlodipine  daily due to initial hypokalemia, she reports she is doing very well and has no side effects of this medication.  No past medical history on file. Current Outpatient Prescriptions  Medication Sig Dispense Refill  . amLODipine (NORVASC) 5 MG tablet Take 1 tablet (5 mg total) by mouth daily. 30 tablet 0  . loratadine (CLARITIN) 10 MG tablet Take 10 mg by mouth daily as needed for allergies.      No current facility-administered medications for this visit.   Family History  Problem Relation Age of Onset  . Hypertension Mother   . Stroke Mother   . Heart attack Mother   . Hypertension Father   . Stroke Father    History   Social History  . Marital Status: Divorced    Spouse Name: N/A  . Number of Children: N/A  . Years of Education: N/A   Social History Main Topics  . Smoking status: Current Every Day Smoker -- 0.50 packs/day for 40 years  . Smokeless tobacco: Not on file  . Alcohol Use: 0.6 oz/week    1 Cans of beer per week  . Drug Use: No  . Sexual Activity: Yes    Birth Control/ Protection: Condom   Other Topics Concern  . Not on file   Social History Narrative   Review of Systems: Review of Systems  Constitutional: Negative for fever, chills and malaise/fatigue.  Eyes: Negative for blurred vision.  Respiratory:  Negative for cough and shortness of breath.   Cardiovascular: Negative for chest pain.  Gastrointestinal: Negative for heartburn and abdominal pain.  Genitourinary: Negative for dysuria.  Musculoskeletal: Negative for myalgias.  Neurological: Negative for dizziness, tingling, sensory change, focal weakness and headaches.  Psychiatric/Behavioral: Negative for depression.     Objective:  Physical Exam: Filed Vitals:   09/08/14 1012  BP: 187/93  Pulse: 91  Temp: 98.2 F (36.8 C)  Height:  (1.702 m)  Weight: 176 lb 4.8 oz (79.969 kg)  SpO2: 98%   Physical Exam  Constitutional: She is well-developed, well-nourished, and in no distress.  HENT:  Head: Normocephalic and atraumatic.  Eyes: Conjunctivae are normal.  Cardiovascular: Normal rate, regular rhythm and normal heart sounds.   No murmur heard. Pulmonary/Chest: Effort normal and breath sounds normal. No respiratory distress. She has no wheezes. She has no rales.  Abdominal: Soft. Bowel sounds are normal.  Musculoskeletal: She exhibits no edema.  Neurological: She displays normal reflexes (brachioradialis bilaterally). No cranial nerve deficit.  5/5 shoulder, elbow, wrist and finger strength in b/l upper extremities. 5/5 gross lower extremity strenth in hip, knee and ankle bilaterally. Grossly intact sensation over UE and LE dermatones bilaterally.  Nursing note and vitals reviewed.    Assessment & Plan:  Case  discussed with Dr. Josem KaufmannKlima  Essential hypertension BP Readings from Last 3 Encounters:  09/08/14 187/93  08/25/14 166/78  08/26/11 154/76    Lab Results  Component Value Date   NA 141 08/25/2014   K 3.8 08/25/2014   CREATININE 0.63 08/25/2014    Assessment: Blood pressure control:  uncontrolled Progress toward BP goal:    unchanged Comments: taking amlodipine brings package to clinic  Plan: Medications:  Amlodipine 5mg  daily Educational resources provided:   Self management tools provided:   Other  plans: check BMP today, if hyopkalemic will eval for secondary cause of hyperaldosteronism, if potassium is normal will start HCTZ at 25mg  daily and see back in 1 month.    Lower extremity weakness Completely resolved. We reviewed the reports of her MRI brain and C spine together.  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   Tobacco abuse We discussed her smoking and the adverse health effects associated with it including heart diease, she is currently smoking 1/2 ppd, has tried to quit cold [place] once before.  She wants to and is ready to quit and would like assistance.   - Nicotine patch 7mcg daily for 2 weeks given 1 refill if needed.     Medications Ordered Meds ordered this encounter  Medications  . nicotine (NICODERM CQ - DOSED IN MG/24 HR) 7 mg/24hr patch    Sig: Place 1 patch (7 mg total) onto the skin daily.    Dispense:  14 patch    Refill:  1  . amLODipine (NORVASC) 5 MG tablet    Sig: Take 1 tablet (5 mg total) by mouth daily.    Dispense:  30 tablet    Refill:  2   Other Orders Orders Placed This Encounter  Procedures  . BMP with Estimated GFR ([id number])

## 2014-09-09 MED ORDER — HYDROCHLOROTHIAZIDE 25 MG PO TABS: 25.0000 mg | ORAL_TABLET | Freq: Every day | ORAL | Status: DC

## 2014-09-09 NOTE — Addendum Note (Signed)
Addended by: Gust RungHOFFMAN, Audy Dauphine C on: 09/09/2014 08:22 AM   Modules accepted: Orders

## 2014-09-09 NOTE — Progress Notes (Signed)
Case discussed with Dr. Hoffman soon after the resident saw the patient.  We reviewed the resident's history and exam and pertinent patient test results.  I agree with the assessment, diagnosis and plan of care documented in the resident's note. 

## 2016-03-18 ENCOUNTER — Emergency Department (HOSPITAL_COMMUNITY): Payer: BLUE CROSS/BLUE SHIELD

## 2016-03-18 ENCOUNTER — Emergency Department (HOSPITAL_COMMUNITY)
Admission: EM | Admit: 2016-03-18 | Discharge: 2016-03-18 | Disposition: A | Discharge status: Home / Self Care | Payer: BLUE CROSS/BLUE SHIELD | Attending: Emergency Medicine | Admitting: Emergency Medicine

## 2016-03-18 ENCOUNTER — Encounter (HOSPITAL_COMMUNITY): Payer: Self-pay

## 2016-03-18 DIAGNOSIS — R0789 Other chest pain: Secondary | ICD-10-CM

## 2016-03-18 DIAGNOSIS — I10 Essential (primary) hypertension: Secondary | ICD-10-CM | POA: Insufficient documentation

## 2016-03-18 DIAGNOSIS — F17210 Nicotine dependence, cigarettes, uncomplicated: Secondary | ICD-10-CM | POA: Diagnosis not present

## 2016-03-18 DIAGNOSIS — R079 Chest pain, unspecified: Secondary | ICD-10-CM | POA: Diagnosis not present

## 2016-03-18 HISTORY — DX: Essential (primary) hypertension: I10

## 2016-03-18 LAB — CBC
HCT: 35.8 % — ABNORMAL LOW (ref 36.0–46.0)
Hemoglobin: 12.4 g/dL (ref 12.0–15.0)
MCH: 29.7 pg (ref 26.0–34.0)
MCHC: 34.6 g/dL (ref 30.0–36.0)
MCV: 85.6 fL (ref 78.0–100.0)
Platelets: 264 10*3/uL (ref 150–400)
RBC: 4.18 MIL/uL (ref 3.87–5.11)
RDW: 14.9 % (ref 11.5–15.5)
WBC: 6.4 10*3/uL (ref 4.0–10.5)

## 2016-03-18 LAB — I-STAT TROPONIN, ED
Troponin i, poc: 0 ng/mL (ref 0.00–0.08)
Troponin i, poc: 0.01 ng/mL (ref 0.00–0.08)

## 2016-03-18 LAB — BASIC METABOLIC PANEL
Anion gap: 7 (ref 5–15)
BUN: 9 mg/dL (ref 6–20)
CO2: 25 mmol/L (ref 22–32)
Calcium: 10.3 mg/dL (ref 8.9–10.3)
Chloride: 109 mmol/L (ref 101–111)
Creatinine, Ser: 0.65 mg/dL (ref 0.44–1.00)
GFR calc Af Amer: >60 mL/min (ref >60)
GFR calc non Af Amer: >60 mL/min (ref >60)
Glucose, Bld: 96 mg/dL (ref 65–99)
Potassium: 3.8 mmol/L (ref 3.5–5.1)
Sodium: 141 mmol/L (ref 135–145)

## 2016-03-18 LAB — D-DIMER, QUANTITATIVE: D-Dimer, Quant: 0.45 ug/mL-FEU (ref 0.00–0.50)

## 2016-03-18 MED ORDER — HYDROCHLOROTHIAZIDE 25 MG PO TABS: 25.0000 mg | ORAL_TABLET | Freq: Every day | ORAL | 0 refills | Status: DC

## 2016-03-18 NOTE — ED Provider Notes (Signed)
[facility]-EMERGENCY DEPT Provider Note   CSN: [id number] Arrival date & time: 03/18/16  1232     History   Chief Complaint Chief Complaint  Patient presents with  . Chest Pain    HPI Laurie Contreras is a 60 year old woman with history of HTN, tobacco abuse.  HPI  She presents with chest pain at 11:30am. She was sitting down at the time. Located in left chest. No radiation. Pulling or sharp in nature. Constant and lasted 10 minutes. Got to 7/10 at worst. Worse with walking. No relieving factors. No associated diaphoresis or nausea. She had associated dyspnea. Denies anxiety. Never had similar pain before. She is still smoking. Sister had heart attack at age 61. No recent illness. No sick contacts. No recent travel or long car rides. Ambulates without assistance. No dyspnea or chest pain with exertion. No chest pain presently.  She took 324mg  of ASA prior to arrival.   Only medication she takes is Benadryl as needed. No primary care provider.   Past Medical History:  Diagnosis Date  . Hypertension     Past Surgical History:  Procedure Laterality Date  . TONSILLECTOMY      OB History    Gravida Para Term Preterm AB Living   2 2 2          SAB TAB Ectopic Multiple Live Births                  Home Medications    Prior to Admission medications   Medication Sig Start Date End Date Taking? Authorizing Provider  diphenhydrAMINE (BENADRYL) 25 mg capsule Take 50 mg by mouth every 6 (six) hours as needed for allergies.   Yes Historical Provider, MD  hydrochlorothiazide (HYDRODIURIL) 25 MG tablet Take 1 tablet (25 mg total) by mouth daily. 03/18/16   Lora PaulaJennifer T Joby Hershkowitz, MD    Family History Family History  Problem Relation Age of Onset  . Hypertension Mother   . Stroke Mother   . Hypertension Father   . Stroke Father   . Heart attack Sister     Social History Social History  Substance Use Topics  . Smoking status: Current Every Day Smoker    Packs/day: 0.50    Years:  40.00    Types: Cigarettes  . Smokeless tobacco: Never Used  . Alcohol use 0.6 oz/week    1 Cans of beer per week     Allergies   Review of patient's allergies indicates no known allergies.   Review of Systems Review of Systems Constitutional: no fevers/chills Eyes: no vision changes Ears, nose, mouth, throat, and face: no cough Respiratory: no shortness of breath Cardiovascular: +chest pain Gastrointestinal: no nausea/vomiting, no abdominal pain, no constipation, no diarrhea Genitourinary: no dysuria, no hematuria Integument: no rash Hematologic/lymphatic: no bleeding/bruising, no edema Musculoskeletal: no arthralgias, no myalgias Neurological: no paresthesias, no weakness   Physical Exam Updated Vital Signs BP 167/99   Pulse 74   Temp 98.5 F (36.9 C) (Oral)   Resp 19   Ht 5\' 7"  (1.702 m)   Wt 81.6 kg   SpO2 97%   BMI 28.19 kg/m   Physical Exam General Apperance: NAD Head: Normocephalic, atraumatic Eyes: PERRL, EOMI, anicteric sclera Ears: Normal external ear canal Nose: Nares normal, septum midline, mucosa normal Throat: Lips, mucosa and tongue normal  Neck: Supple, trachea midline Back: No tenderness or bony abnormality  Lungs: Clear to auscultation bilaterally. No wheezes, rhonchi or rales. Breathing comfortably Chest Wall: Nontender, no deformity Heart: Regular  rate and rhythm, no murmur/rub/gallop Abdomen: Soft, nontender, nondistended, no rebound/guarding Extremities: Normal, atraumatic, warm and well perfused, no edema Pulses: 2+ throughout Skin: No rashes or lesions Neurologic: Alert and oriented x 3. CNII-XII intact. Normal strength and sensation  ED Treatments / Results  Labs (all labs ordered are listed, but only abnormal results are displayed) Labs Reviewed  CBC - Abnormal; Notable for the following:       Result Value   HCT 35.8 (*)    All other components within normal limits  BASIC METABOLIC PANEL  D-DIMER, QUANTITATIVE (NOT AT [facility])   I-STAT TROPOININ, ED    EKG  EKG Interpretation  Date/Time:  Sunday March 18 2016 12:39:57 EDT Ventricular Rate:  62 PR Interval:    QRS Duration: 100 QT Interval:  414 QTC Calculation: 421 R Axis:   30 Text Interpretation:  Sinus rhythm Probable left atrial enlargement LVH with secondary repolarization abnormality Confirmed by Ranae Palms  MD, DAVID ([id number]) on 03/18/2016 12:54:00 PM       Radiology Dg Chest Port 1 View  Result Date: 03/18/2016 CLINICAL DATA:  Acute left chest pain. EXAM: PORTABLE CHEST 1 VIEW COMPARISON:  08/24/2014 chest radiograph FINDINGS: Cardiomegaly identified. There is no evidence of focal airspace disease, pulmonary edema, suspicious pulmonary nodule/mass, pleural effusion, or pneumothorax. No acute bony abnormalities are identified. IMPRESSION: Cardiomegaly without evidence of acute cardiopulmonary disease. Electronically Signed   By: Harmon Pier M.D.   On: 03/18/2016 13:14   Procedures   Medications Ordered in ED Medications - No data to display   Initial Impression / Assessment and Plan / ED Course  I have reviewed the triage vital signs and the nursing notes.  Pertinent labs & imaging results that were available during my care of the patient were reviewed by me and considered in my medical decision making (see chart for details).  Clinical Course  1:10pm evaluated pt 3:00pm reevaluated pt. No recurrent chest pain.  Final Clinical Impressions(s) / ED Diagnoses   Final diagnoses:  Atypical chest pain  Initial troponin POC 0 and EKG with nonspecific changes. No acute ischemic changes. D-dimer 0.45, within normal limits making PE unlikely. BMP and CBC unremarkable. HEART score 4.   Troponin POC at 4PM then d/c with follow up with primary care provider.  Restart HCTZ 25mg  daily for hypertension.  New Prescriptions Current Discharge Medication List    hydrochlorothiazide (HYDRODIURIL) 25 MG tablet Take 1 tablet (25 mg total) by mouth  daily.   Lora Paula, MD 03/18/16 1520    Lora Paula, MD 03/18/16 6045    Lora Paula, MD 03/18/16 4098    Loren Racer, MD 03/19/16 [phone]

## 2016-03-18 NOTE — ED Triage Notes (Signed)
Pt was at church when she had sudden onset of left sided chest pain that was described as a sharp stabbing pain, non-radiating.  Pt took 324mg  ASA PTA.  EMS reports that pt was symptom free upon their arrival and denies any pain at this time.  Pt in NAD.

## 2016-03-18 NOTE — Discharge Instructions (Addendum)
Take hydrochlorothiazide daily for your blood pressure. Please establish care with a primary care provider.  Return to ER if you have worse chest pain, shortness of breath.

## 2016-03-18 NOTE — ED Provider Notes (Signed)
  Physical Exam  BP 173/93   Pulse 66   Temp 98.5 F (36.9 C) (Oral)   Resp 19   Ht 5\' 7"  (1.702 m)   Wt 180 lb (81.6 kg)   SpO2 96%   BMI 28.19 kg/m   Physical Exam  ED Course  Procedures  MDM Care assumed at sign out from Dr. Ranae PalmsYelverton. Patient has acute onset of chest pain today. D-dimer ng, labs unremarkable. CXR unremarkable. Patient was noted to be hypertensive and previous team prescribed HCTZ. Sign out pending delta trop. Delta trop neg. Pain free. HCTZ prescribed by previous team. Will dc home.    Laurie Panderavid Hsienta Camilia Caywood, MD 03/18/16 [phone]

## 2016-10-17 ENCOUNTER — Encounter: Payer: Self-pay | Admitting: Internal Medicine

## 2016-10-17 ENCOUNTER — Ambulatory Visit (INDEPENDENT_AMBULATORY_CARE_PROVIDER_SITE_OTHER): Payer: BLUE CROSS/BLUE SHIELD | Admitting: Internal Medicine

## 2016-10-17 ENCOUNTER — Encounter (INDEPENDENT_AMBULATORY_CARE_PROVIDER_SITE_OTHER): Payer: Self-pay

## 2016-10-17 VITALS — BP 184/78 | HR 64 | Temp 98.1°F | Ht 67.0 in | Wt 180.1 lb

## 2016-10-17 DIAGNOSIS — I10 Essential (primary) hypertension: Secondary | ICD-10-CM

## 2016-10-17 DIAGNOSIS — M7121 Synovial cyst of popliteal space [Baker], right knee: Secondary | ICD-10-CM

## 2016-10-17 DIAGNOSIS — F17210 Nicotine dependence, cigarettes, uncomplicated: Secondary | ICD-10-CM | POA: Diagnosis not present

## 2016-10-17 DIAGNOSIS — M7989 Other specified soft tissue disorders: Secondary | ICD-10-CM | POA: Diagnosis not present

## 2016-10-17 DIAGNOSIS — Z720 Tobacco use: Secondary | ICD-10-CM

## 2016-10-17 MED ORDER — CHLORTHALIDONE 25 MG PO TABS: 25.0000 mg | ORAL_TABLET | Freq: Every day | ORAL | 2 refills | Status: DC

## 2016-10-17 MED ORDER — CHLORTHALIDONE 50 MG PO TABS: 50.0000 mg | ORAL_TABLET | Freq: Every day | ORAL | 1 refills | Status: DC

## 2016-10-17 MED ORDER — BUPROPION HCL ER (XL) 150 MG PO TB24: 150.0000 mg | ORAL_TABLET | Freq: Every day | ORAL | 2 refills | Status: DC

## 2016-10-17 NOTE — Assessment & Plan Note (Addendum)
Patient with bilateral lower extremity edema. Right more prominent than left. Patient also has a painful area of swelling in the right popliteal fossa. She also has knee pain in the right knee and popliteal fossa for approximately one month in duration. Does not remember any injury. She has noticed ongoing swelling and an aching pain in this area. Ultrasound shows a fluid-filled area consistent with a Baker's cyst. We offered the patient a steroid injection clinic today but she said she did not have time for this and would like to see if this would improve on its own. We advised the patient to take NSAIDs to see if she gets relief from her pain. She will return to clinic in 1 month for evaluation of her essential hypertension. If at that visit she continues to have pain in his right knee we'll proceed with steroid injection. -- Nonsteroidal anti-inflammatory drugs -- Follow-up in one month

## 2016-10-17 NOTE — Progress Notes (Signed)
   CC: Hypertension and leg edema HPI: [name] is a 61 y.o. female with a past medical history as listed below who presents with hypertension and lower extremity swelling  Past Medical History:  Diagnosis Date  . Hypertension      Review of Systems: Denies cough and SOB. Denies chest pain. Denies polyuria. Denies "foamy" urination.  Physical Exam: Vitals:   10/17/16 1540  BP: (!) 184/78  Pulse: 64  Temp: 98.1 F (36.7 C)  TempSrc: Oral  SpO2: 100%  Weight: 180 lb 1.6 oz (81.7 kg)  Height: 5\' 7"  (1.702 m)   General appearance: alert and cooperative Head: Normocephalic, without obvious abnormality, atraumatic Lungs: clear to auscultation bilaterally Heart: regular rate and rhythm, S1, S2 normal, no murmur, click, rub or gallop Abdomen: soft, non-tender; bowel sounds normal; no masses,  no organomegaly Extremities: Bilateral lower extremity edema. Right greater than left. Fluctuant right abscess in the popliteal fossa. Please see under Baker's cyst for more details.  Assessment & Plan:  See encounters tab for problem based medical decision making. Patient seen with Dr. Cleda DaubE. Hoffman  Signed: Thomasene Lotaylor, Britain Saber, MD 10/17/2016, 3:49 PM  Pager: [phone]

## 2016-10-17 NOTE — Progress Notes (Signed)
Error

## 2016-10-17 NOTE — Assessment & Plan Note (Signed)
The patient has a significant and long tobacco abuse history. She says several of her church members have taken bupropion and have been to quit smoking. She requests that this medication be started today as she wants to quit smoking. She has no history of depression and psychiatric illness or suicidal ideation. I think this would be a great idea to start this medication. I will prescribe bupropion 150 mg extended release. -- Bupropion 150 mg extended release once daily for smoking cessation

## 2016-10-17 NOTE — Patient Instructions (Signed)
It was a pleasure seeing you today. Thank you for choosing [facility] for your healthcare needs.   For your blood pressure and leg swelling I have started a medicine called chlorthalidone. You're to take 1 pill once daily.  To help quit smoking I have started bupropion. Take 1 pill once daily.  Please return to clinic in 1 month for follow-up of your blood pressure and to see how the bupropion is helping to quit smoking.  The back of your leg has a Baker's cyst. If this does not go away in a month or bothers you please call the clinic and we can treat this with steroids. Baker Cyst A Baker cyst, also called a popliteal cyst, is a sac-like growth that forms at the back of the knee. The cyst forms when the fluid-filled sac (bursa) that cushions the knee joint becomes enlarged. The bursa that becomes a Baker cyst is located at the back of the knee joint. What are the causes? In most cases, a Baker cyst results from another knee problem that causes swelling inside the knee. This makes the fluid inside the knee joint (synovial fluid) flow into the bursa behind the knee, causing the bursa to enlarge. What increases the risk? You may be more likely to develop a Baker cyst if you already have a knee problem, such as:  A tear in cartilage that cushions the knee joint (meniscal tear).  A tear in the tissues that connect the bones of the knee joint (ligament tear).  Knee swelling from osteoarthritis, rheumatoid arthritis, or gout. What are the signs or symptoms? A Baker cyst does not always cause symptoms. A lump behind the knee may be the only sign of the condition. The lump may be painful, especially when the knee is straightened. If the lump is painful, the pain may come and go. The knee may also be stiff. Symptoms may quickly get more severe if the cyst breaks open (ruptures). If your cyst ruptures, signs and symptoms may affect the knee and the back of the lower leg (calf) and may  include:  Sudden or worsening pain.  Swelling.  Bruising. How is this diagnosed? This condition may be diagnosed based on your symptoms and medical history. Your health care provider will also do a physical exam. This may include:  Feeling the cyst to check whether it is tender.  Checking your knee for signs of another knee condition that causes swelling. You may have imaging tests, such as:  X-rays.  MRI.  Ultrasound. How is this treated? A Baker cyst that is not painful may go away without treatment. If the cyst gets large or painful, it will likely get better if the underlying knee problem is treated. Treatment for a Baker cyst may include:  Resting.  Keeping weight off of the knee. This means not leaning on the knee to support your body weight.  NSAIDs to reduce pain and swelling.  A procedure to drain the fluid from the cyst with a needle (aspiration). You may also get an injection of a medicine that reduces swelling (steroid).  Surgery. This may be needed if other treatments do not work. This usually involves correcting knee damage and removing the cyst. Follow these instructions at home:  Take over-the-counter and prescription medicines only as told by your health care provider.  Rest and return to your normal activities as told by your health care provider. Avoid activities that make pain or swelling worse. Ask your health care provider what activities  are safe for you.  Keep all follow-up visits as told by your health care provider. This is important. Contact a health care provider if:  You have knee pain, stiffness, or swelling that does not get better. Get help right away if:  You have sudden or worsening pain and swelling in your calf area. This information is not intended to replace advice given to you by your health care provider. Make sure you discuss any questions you have with your health care provider. Document Released: 05/21/2005 Document Revised:  02/09/2016 Document Reviewed: 02/09/2016 Elsevier Interactive Patient Education  2017 [employer]

## 2016-10-17 NOTE — Assessment & Plan Note (Signed)
The patient has a history of essential hypertension. Blood pressure in the office today as 184/78. She has previously been on hydrochlorothiazide and tolerated the medication well. Today we'll start chlorthalidone 25 mg once daily. We'll also obtain a BMP today. We'll have the patient follow-up in clinic in 1 month for repeat basic metabolic panel and reevaluation of her blood pressure.  -- Chlorthalidone 25 mg once daily -- Basic metabolic panel today

## 2016-10-18 LAB — BMP8+ANION GAP
Anion Gap: 14 mmol/L (ref 10.0–18.0)
BUN/Creatinine Ratio: 16 (ref 12–28)
BUN: 10 mg/dL (ref 8–27)
CO2: 25 mmol/L (ref 18–29)
Calcium: 10.1 mg/dL (ref 8.7–10.3)
Chloride: 105 mmol/L (ref 96–106)
Creatinine, Ser: 0.63 mg/dL (ref 0.57–1.00)
GFR calc Af Amer: 112 mL/min/{1.73_m2} (ref >59)
GFR calc non Af Amer: 97 mL/min/{1.73_m2} (ref >59)
Glucose: 100 mg/dL — ABNORMAL HIGH (ref 65–99)
Potassium: 4.2 mmol/L (ref 3.5–5.2)
Sodium: 144 mmol/L (ref 134–144)

## 2016-10-19 NOTE — Progress Notes (Signed)
Internal Medicine Clinic Attending  I saw and evaluated the patient.  I personally confirmed the key portions of the history and exam documented by Dr. Ladona Ridgelaylor and I reviewed pertinent patient test results.  The assessment, diagnosis, and plan were formulated together and I agree with the documentation in the resident's note. POCUS confirmed our clinical impression of baker's cyst, on U/S size is about 1x 1.5cm, no doppler flow. 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  We discussed treatment options, patient elected for trial of NSAID and follow up, if not better will proceed with corticosteroid injection to right knee.

## 2016-11-15 ENCOUNTER — Ambulatory Visit (INDEPENDENT_AMBULATORY_CARE_PROVIDER_SITE_OTHER): Payer: BLUE CROSS/BLUE SHIELD | Admitting: Internal Medicine

## 2016-11-15 ENCOUNTER — Encounter: Payer: Self-pay | Admitting: Internal Medicine

## 2016-11-15 DIAGNOSIS — I10 Essential (primary) hypertension: Secondary | ICD-10-CM | POA: Diagnosis not present

## 2016-11-15 DIAGNOSIS — F17210 Nicotine dependence, cigarettes, uncomplicated: Secondary | ICD-10-CM

## 2016-11-15 DIAGNOSIS — M7121 Synovial cyst of popliteal space [Baker], right knee: Secondary | ICD-10-CM | POA: Diagnosis not present

## 2016-11-15 DIAGNOSIS — Z79899 Other long term (current) drug therapy: Secondary | ICD-10-CM | POA: Diagnosis not present

## 2016-11-15 NOTE — Progress Notes (Signed)
   CC: For follow-up of her blood pressure and right knee pain.  HPI:  Ms.Laurie Carmon GinsbergF Laurie Contreras is a 61 y.o. with past medical history significant for hypertension and recently diagnosed Baker's cyst, came to the clinic for follow-up.  Her right knee pain has been improved, still having mild swelling at the right popliteal fossa, stating it is completely nontender and she can walk around without any pain. She denies any fever or chills.  Her blood pressure was mildly elevated today on initial check, she was normotensive on recheck. according to patient she is compliant with her medication. She is on chlorthalidone 25 mg daily. Her blood pressure remains elevated during her previous clinic visits.  She has no other complaints.  Past Medical History:  Diagnosis Date  . Hypertension     Review of Systems:  As per HPI.  Physical Exam:  Vitals:   11/15/16 0939  BP: (!) 147/75  Pulse: 77  Temp: 98 F (36.7 C)  TempSrc: Oral  SpO2: 99%  Weight: 172 lb 6.4 oz (78.2 kg)  Height: 5\' 7"  (1.702 m)   Vitals:   11/15/16 0939  BP: (!) 147/75  Pulse: 77  Temp: 98 F (36.7 C)  TempSrc: Oral  SpO2: 99%  Weight: 172 lb 6.4 oz (78.2 kg)  Height: 5\' 7"  (1.702 m)   General: Vital signs reviewed.  Patient is well-developed and well-nourished, in no acute distress and cooperative with exam.  Cardiovascular: RRR, S1 normal, S2 normal, no murmurs, gallops, or rubs. Pulmonary/Chest: Clear to auscultation bilaterally, no wheezes, rales, or rhonchi. Abdominal: Soft, non-tender, non-distended, BS +, no masses, organomegaly, or guarding present.  Musculoskeletal: Small swelling at right popliteal fossa, non tender,No joint deformities, erythema, or stiffness, ROM full and nontender. Extremities: No lower extremity edema bilaterally,  pulses symmetric and intact bilaterally. No cyanosis or clubbing. Skin: Warm, dry and intact. No rashes or erythema. Psychiatric: Normal mood and affect. speech and  behavior is normal. Cognition and memory are normal.  Assessment & Plan:   See Encounters Tab for problem based charting.  Patient discussed with Dr. Cyndie ChimeGranfortuna.

## 2016-11-15 NOTE — Progress Notes (Signed)
Medicine attending: Medical history, presenting problems, physical findings, and medications, reviewed with resident physician Dr Sumayya Amin on the day of the patient visit and I concur with her evaluation and management plan. 

## 2016-11-15 NOTE — Assessment & Plan Note (Signed)
BP Readings from Last 3 Encounters:  11/15/16 127/68  10/17/16 (!) 184/78  03/18/16 156/85   She was normotensive today on recheck, her initial reading was 147/75. She is compliant with her chlorthalidone, she was taking it at bedtime. I told her to take it in the morning, as it can cause sleep disturbance because of excessive urination. Patient was experiencing nocturia and was wondering about the cause. She said that she will take her medication in the morning now.  If her blood pressure started going up, we can think about combining her thiazide diuretics with ACE or ARB.  -Continue current management of chlorthalidone 25 mg daily.

## 2016-11-15 NOTE — Patient Instructions (Signed)
Thank you for visiting clinic today. I'm glad you are doing well. Your blood pressure on recheck was normal. I will not make any changes to your medication at this time than. Please follow-up with your PCP in 3 months.

## 2016-11-15 NOTE — Assessment & Plan Note (Signed)
Her right knee pain has been improved, still having mild swelling at the right popliteal fossa, stating it is completely nontender and she can walk around without any pain. She denies any fever or chills. Stating that Aleve really help with her pain.  On exam she is having a small nontender swelling at her right popliteal fossa. No other edema or erythema.  Continue with Aleve when necessary. Her symptoms are improving and she is not interested in any injections.

## 2017-01-01 ENCOUNTER — Emergency Department (HOSPITAL_COMMUNITY): Payer: No Typology Code available for payment source

## 2017-01-01 ENCOUNTER — Encounter (HOSPITAL_COMMUNITY): Payer: Self-pay | Admitting: Emergency Medicine

## 2017-01-01 ENCOUNTER — Emergency Department (HOSPITAL_COMMUNITY)
Admission: EM | Admit: 2017-01-01 | Discharge: 2017-01-01 | Disposition: A | Discharge status: Home / Self Care | Payer: No Typology Code available for payment source | Attending: Emergency Medicine | Admitting: Emergency Medicine

## 2017-01-01 DIAGNOSIS — Y998 Other external cause status: Secondary | ICD-10-CM | POA: Diagnosis not present

## 2017-01-01 DIAGNOSIS — F17210 Nicotine dependence, cigarettes, uncomplicated: Secondary | ICD-10-CM | POA: Insufficient documentation

## 2017-01-01 DIAGNOSIS — S39012A Strain of muscle, fascia and tendon of lower back, initial encounter: Secondary | ICD-10-CM | POA: Insufficient documentation

## 2017-01-01 DIAGNOSIS — I10 Essential (primary) hypertension: Secondary | ICD-10-CM | POA: Insufficient documentation

## 2017-01-01 DIAGNOSIS — Y939 Activity, unspecified: Secondary | ICD-10-CM | POA: Insufficient documentation

## 2017-01-01 DIAGNOSIS — S3992XA Unspecified injury of lower back, initial encounter: Secondary | ICD-10-CM | POA: Diagnosis not present

## 2017-01-01 DIAGNOSIS — Z79899 Other long term (current) drug therapy: Secondary | ICD-10-CM | POA: Diagnosis not present

## 2017-01-01 DIAGNOSIS — Y92410 Unspecified street and highway as the place of occurrence of the external cause: Secondary | ICD-10-CM | POA: Insufficient documentation

## 2017-01-01 MED ORDER — CYCLOBENZAPRINE HCL 10 MG PO TABS
5.0000 mg | ORAL_TABLET | Freq: Once | ORAL | Status: AC
  Administered 2017-01-01: 5 mg via ORAL
  Filled 2017-01-01: qty 1

## 2017-01-01 MED ORDER — NAPROXEN 375 MG PO TABS: 375.0000 mg | ORAL_TABLET | Freq: Two times a day (BID) | ORAL | 0 refills | Status: DC

## 2017-01-01 MED ORDER — CYCLOBENZAPRINE HCL 10 MG PO TABS: 10.0000 mg | ORAL_TABLET | Freq: Three times a day (TID) | ORAL | 0 refills | Status: DC | PRN

## 2017-01-01 MED ORDER — NAPROXEN 250 MG PO TABS
375.0000 mg | ORAL_TABLET | Freq: Once | ORAL | Status: AC
  Administered 2017-01-01: 375 mg via ORAL
  Filled 2017-01-01: qty 2
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
## 2017-01-01 NOTE — Discharge Instructions (Signed)
X-ray was normal. This is likely musculoskeletal sprain.  Please take the Naproxen as prescribed for pain. Do not take any additional NSAIDs including Motrin, Aleve, Ibuprofen, Advil.  Please the the take the Flexeril for muscle relaxation. This medication will make you drowsy so avoid situation that could place you in danger.   If symptoms persist make she'll follow-up with her primary care doctor for possible further imaging. If he develops any numbness in her groin, numbness in her legs, difficulties urinating or loss of bowel or bladder return to the ED.

## 2017-01-14 ENCOUNTER — Other Ambulatory Visit: Payer: Self-pay

## 2017-01-14 DIAGNOSIS — I10 Essential (primary) hypertension: Secondary | ICD-10-CM

## 2017-01-14 NOTE — Telephone Encounter (Signed)
Requesting bp med to be filled.

## 2017-01-15 MED ORDER — CHLORTHALIDONE 25 MG PO TABS: 25.0000 mg | ORAL_TABLET | Freq: Every day | ORAL | 0 refills | Status: DC
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
## 2017-02-21 ENCOUNTER — Encounter: Payer: Self-pay | Admitting: Internal Medicine

## 2017-02-21 ENCOUNTER — Ambulatory Visit (INDEPENDENT_AMBULATORY_CARE_PROVIDER_SITE_OTHER): Payer: BLUE CROSS/BLUE SHIELD | Admitting: Internal Medicine

## 2017-02-21 VITALS — BP 150/68 | HR 80 | Temp 98.3°F | Ht 67.0 in | Wt 172.2 lb

## 2017-02-21 DIAGNOSIS — I700 Atherosclerosis of aorta: Secondary | ICD-10-CM | POA: Diagnosis not present

## 2017-02-21 DIAGNOSIS — I10 Essential (primary) hypertension: Secondary | ICD-10-CM | POA: Diagnosis not present

## 2017-02-21 DIAGNOSIS — Z9189 Other specified personal risk factors, not elsewhere classified: Secondary | ICD-10-CM | POA: Diagnosis not present

## 2017-02-21 DIAGNOSIS — G8929 Other chronic pain: Secondary | ICD-10-CM

## 2017-02-21 DIAGNOSIS — E785 Hyperlipidemia, unspecified: Secondary | ICD-10-CM

## 2017-02-21 DIAGNOSIS — Z1211 Encounter for screening for malignant neoplasm of colon: Secondary | ICD-10-CM

## 2017-02-21 DIAGNOSIS — M1711 Unilateral primary osteoarthritis, right knee: Secondary | ICD-10-CM

## 2017-02-21 DIAGNOSIS — E782 Mixed hyperlipidemia: Secondary | ICD-10-CM

## 2017-02-21 DIAGNOSIS — Z1231 Encounter for screening mammogram for malignant neoplasm of breast: Secondary | ICD-10-CM

## 2017-02-21 DIAGNOSIS — Z23 Encounter for immunization: Secondary | ICD-10-CM

## 2017-02-21 DIAGNOSIS — Z87828 Personal history of other (healed) physical injury and trauma: Secondary | ICD-10-CM

## 2017-02-21 DIAGNOSIS — M545 Low back pain: Secondary | ICD-10-CM | POA: Diagnosis not present

## 2017-02-21 DIAGNOSIS — Z1159 Encounter for screening for other viral diseases: Secondary | ICD-10-CM | POA: Diagnosis not present

## 2017-02-21 DIAGNOSIS — F17210 Nicotine dependence, cigarettes, uncomplicated: Secondary | ICD-10-CM

## 2017-02-21 DIAGNOSIS — M7121 Synovial cyst of popliteal space [Baker], right knee: Secondary | ICD-10-CM | POA: Diagnosis not present

## 2017-02-21 DIAGNOSIS — Z79899 Other long term (current) drug therapy: Secondary | ICD-10-CM

## 2017-02-21 MED ORDER — CHLORTHALIDONE 25 MG PO TABS: 25.0000 mg | ORAL_TABLET | Freq: Every day | ORAL | 3 refills | Status: DC

## 2017-02-21 MED ORDER — AMLODIPINE BESYLATE 2.5 MG PO TABS: 2.5000 mg | ORAL_TABLET | Freq: Every day | ORAL | 3 refills | Status: DC

## 2017-02-21 NOTE — Patient Instructions (Addendum)
I will call with the lab results.  I am starting you on amlodipine 2.5mg  daily.
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
## 2017-02-21 NOTE — Assessment & Plan Note (Addendum)
HPI: Taking chlorthalidone  daily.  A: Essential HTN not at goal P: Continue chlorthalidone  daily. Add Amlodipine 2.5mg  daily

## 2017-02-21 NOTE — Assessment & Plan Note (Signed)
Noted on Lumbar radiographs from ED visit in July.  Will risk stratify with Lipid panel and consider statin.

## 2017-02-22 ENCOUNTER — Other Ambulatory Visit: Payer: Self-pay | Admitting: Internal Medicine

## 2017-02-22 DIAGNOSIS — M545 Low back pain, unspecified: Secondary | ICD-10-CM | POA: Insufficient documentation

## 2017-02-22 DIAGNOSIS — Z1231 Encounter for screening mammogram for malignant neoplasm of breast: Secondary | ICD-10-CM

## 2017-02-22 DIAGNOSIS — M1711 Unilateral primary osteoarthritis, right knee: Secondary | ICD-10-CM | POA: Insufficient documentation

## 2017-02-22 DIAGNOSIS — E785 Hyperlipidemia, unspecified: Secondary | ICD-10-CM | POA: Insufficient documentation

## 2017-02-22 LAB — CMP14 + ANION GAP
ALT: 12 IU/L (ref 0–32)
AST: 21 IU/L (ref 0–40)
Albumin/Globulin Ratio: 1.3 (ref 1.2–2.2)
Albumin: 4.4 g/dL (ref 3.6–4.8)
Alkaline Phosphatase: 89 IU/L (ref 39–117)
Anion Gap: 14 mmol/L (ref 10.0–18.0)
BUN/Creatinine Ratio: 19 (ref 12–28)
BUN: 13 mg/dL (ref 8–27)
Bilirubin Total: 0.3 mg/dL (ref 0.0–1.2)
CO2: 27 mmol/L (ref 20–29)
Calcium: 10.7 mg/dL — ABNORMAL HIGH (ref 8.7–10.3)
Chloride: 100 mmol/L (ref 96–106)
Creatinine, Ser: 0.69 mg/dL (ref 0.57–1.00)
GFR calc Af Amer: 109 mL/min/{1.73_m2} (ref >59)
GFR calc non Af Amer: 94 mL/min/{1.73_m2} (ref >59)
Globulin, Total: 3.4 g/dL (ref 1.5–4.5)
Glucose: 111 mg/dL — ABNORMAL HIGH (ref 65–99)
Potassium: 3.2 mmol/L — ABNORMAL LOW (ref 3.5–5.2)
Sodium: 141 mmol/L (ref 134–144)
Total Protein: 7.8 g/dL (ref 6.0–8.5)

## 2017-02-22 LAB — LIPID PANEL
Chol/HDL Ratio: 6.6 ratio — ABNORMAL HIGH (ref 0.0–4.4)
Cholesterol, Total: 237 mg/dL — ABNORMAL HIGH (ref 100–199)
HDL: 36 mg/dL — ABNORMAL LOW (ref >39)
LDL Calculated: 160 mg/dL — ABNORMAL HIGH (ref 0–99)
Triglycerides: 207 mg/dL — ABNORMAL HIGH (ref 0–149)
VLDL Cholesterol Cal: 41 mg/dL — ABNORMAL HIGH (ref 5–40)

## 2017-02-22 LAB — HEPATITIS C ANTIBODY: Hep C Virus Ab: <0.1 s/co ratio (ref 0.0–0.9)
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
## 2017-02-22 NOTE — Assessment & Plan Note (Signed)
A: hyperlipidemia  P:Lipid panel has returned, used to calculate 10 year ASCVD= 29%.   -Will recommend starting Atrovastatin  daily, attempted to reach patient by phone but no answer will try again.

## 2017-03-05 MED ORDER — ATORVASTATIN CALCIUM 40 MG PO TABS: 40.0000 mg | ORAL_TABLET | Freq: Every day | ORAL | 3 refills | Status: DC

## 2017-03-05 NOTE — Addendum Note (Signed)
Addended by: Gust Rung on: 03/05/2017 03:22 PM   Modules accepted: Orders

## 2017-03-07 ENCOUNTER — Ambulatory Visit
Admission: RE | Admit: 2017-03-07 | Discharge: 2017-03-07 | Disposition: A | Discharge status: Home / Self Care | Payer: BLUE CROSS/BLUE SHIELD | Source: Ambulatory Visit | Attending: Internal Medicine | Admitting: Internal Medicine

## 2017-03-07 DIAGNOSIS — Z1231 Encounter for screening mammogram for malignant neoplasm of breast: Secondary | ICD-10-CM | POA: Diagnosis not present

## 2017-03-11 ENCOUNTER — Other Ambulatory Visit: Payer: Self-pay | Admitting: Internal Medicine

## 2017-03-11 DIAGNOSIS — R928 Other abnormal and inconclusive findings on diagnostic imaging of breast: Secondary | ICD-10-CM

## 2017-03-13 ENCOUNTER — Ambulatory Visit
Admission: RE | Admit: 2017-03-13 | Discharge: 2017-03-13 | Disposition: A | Discharge status: Home / Self Care | Payer: BLUE CROSS/BLUE SHIELD | Source: Ambulatory Visit | Attending: Internal Medicine | Admitting: Internal Medicine

## 2017-03-13 ENCOUNTER — Encounter: Payer: Self-pay | Admitting: Internal Medicine

## 2017-03-13 ENCOUNTER — Other Ambulatory Visit: Payer: Self-pay | Admitting: Internal Medicine

## 2017-03-13 DIAGNOSIS — R922 Inconclusive mammogram: Secondary | ICD-10-CM | POA: Diagnosis not present

## 2017-03-13 DIAGNOSIS — R928 Other abnormal and inconclusive findings on diagnostic imaging of breast: Secondary | ICD-10-CM

## 2017-03-13 DIAGNOSIS — N6320 Unspecified lump in the left breast, unspecified quadrant: Secondary | ICD-10-CM | POA: Insufficient documentation

## 2017-03-13 DIAGNOSIS — N63 Unspecified lump in unspecified breast: Secondary | ICD-10-CM

## 2017-03-13 DIAGNOSIS — N6489 Other specified disorders of breast: Secondary | ICD-10-CM | POA: Diagnosis not present

## 2017-03-19 DIAGNOSIS — Z1211 Encounter for screening for malignant neoplasm of colon: Secondary | ICD-10-CM | POA: Diagnosis not present

## 2017-03-19 DIAGNOSIS — K621 Rectal polyp: Secondary | ICD-10-CM | POA: Diagnosis not present

## 2017-03-19 DIAGNOSIS — K635 Polyp of colon: Secondary | ICD-10-CM | POA: Diagnosis not present

## 2017-03-19 DIAGNOSIS — Z800 Family history of malignant neoplasm of digestive organs: Secondary | ICD-10-CM | POA: Diagnosis not present

## 2017-03-21 DIAGNOSIS — K635 Polyp of colon: Secondary | ICD-10-CM | POA: Diagnosis not present

## 2017-04-09 ENCOUNTER — Encounter: Payer: Self-pay | Admitting: Internal Medicine

## 2017-04-09 DIAGNOSIS — Z86010 Personal history of colonic polyps: Secondary | ICD-10-CM | POA: Insufficient documentation

## 2017-04-09 DIAGNOSIS — K635 Polyp of colon: Secondary | ICD-10-CM | POA: Insufficient documentation

## 2017-09-13 ENCOUNTER — Other Ambulatory Visit: Payer: BLUE CROSS/BLUE SHIELD

## 2017-09-19 ENCOUNTER — Ambulatory Visit
Admission: RE | Admit: 2017-09-19 | Discharge: 2017-09-19 | Disposition: A | Discharge status: Home / Self Care | Payer: BLUE CROSS/BLUE SHIELD | Source: Ambulatory Visit | Attending: Internal Medicine | Admitting: Internal Medicine

## 2017-09-19 ENCOUNTER — Other Ambulatory Visit: Payer: Self-pay | Admitting: Internal Medicine

## 2017-09-19 DIAGNOSIS — D249 Benign neoplasm of unspecified breast: Secondary | ICD-10-CM | POA: Diagnosis not present

## 2017-09-19 DIAGNOSIS — N63 Unspecified lump in unspecified breast: Secondary | ICD-10-CM

## 2017-09-19 DIAGNOSIS — R922 Inconclusive mammogram: Secondary | ICD-10-CM | POA: Diagnosis not present

## 2017-10-23 ENCOUNTER — Other Ambulatory Visit: Payer: BLUE CROSS/BLUE SHIELD

## 2018-05-16 ENCOUNTER — Ambulatory Visit: Payer: BLUE CROSS/BLUE SHIELD

## 2018-05-20 ENCOUNTER — Ambulatory Visit: Payer: BLUE CROSS/BLUE SHIELD | Admitting: Internal Medicine

## 2018-05-20 ENCOUNTER — Encounter: Payer: Self-pay | Admitting: Internal Medicine

## 2018-05-20 ENCOUNTER — Other Ambulatory Visit: Payer: Self-pay

## 2018-05-20 VITALS — BP 166/78 | HR 80 | Temp 98.8°F | Ht 67.0 in | Wt 166.9 lb

## 2018-05-20 DIAGNOSIS — M25511 Pain in right shoulder: Secondary | ICD-10-CM

## 2018-05-20 DIAGNOSIS — I10 Essential (primary) hypertension: Secondary | ICD-10-CM | POA: Diagnosis not present

## 2018-05-20 DIAGNOSIS — E785 Hyperlipidemia, unspecified: Secondary | ICD-10-CM

## 2018-05-20 DIAGNOSIS — E782 Mixed hyperlipidemia: Secondary | ICD-10-CM

## 2018-05-20 DIAGNOSIS — Z79899 Other long term (current) drug therapy: Secondary | ICD-10-CM

## 2018-05-20 MED ORDER — DICLOFENAC SODIUM 1 % TD GEL: 2.0000 g | Freq: Four times a day (QID) | TRANSDERMAL | 1 refills | Status: DC

## 2018-05-20 MED ORDER — ATORVASTATIN CALCIUM 40 MG PO TABS: 40.0000 mg | ORAL_TABLET | Freq: Every day | ORAL | 3 refills | Status: DC

## 2018-05-20 MED ORDER — AMLODIPINE BESYLATE 5 MG PO TABS: 5.0000 mg | ORAL_TABLET | Freq: Every day | ORAL | 3 refills | Status: DC

## 2018-05-20 MED ORDER — CHLORTHALIDONE 25 MG PO TABS: 25.0000 mg | ORAL_TABLET | Freq: Every day | ORAL | 3 refills | Status: DC

## 2018-05-20 NOTE — Progress Notes (Signed)
   CC: right shoulder pain, blood pressure  HPI:  [name] is a 62 y.o. female with a PMHx listed below presenting for right shoulder pain and refills on her medications.  For details of today's visit and the status of his chronic medical issues please refer to the assessment and plan.   Past Medical History:  Diagnosis Date  . Hypertension    Review of Systems:   Review of Systems  Respiratory: Negative for shortness of breath.   Cardiovascular: Negative for chest pain and palpitations.  Musculoskeletal: Positive for joint pain. Negative for myalgias.  Neurological: Negative for tingling, sensory change, weakness and headaches.     Physical Exam:  Vitals:   05/20/18 1517  BP: (!) 166/78  Pulse: 80  Temp: 98.8 F (37.1 C)  TempSrc: Oral  SpO2: 93%  Weight: 166 lb 14.4 oz (75.7 kg)  Height: 5\' 7"  (1.702 m)    Physical Exam  Constitutional: She is oriented to person, place, and time and well-developed, well-nourished, and in no distress.  Cardiovascular: Normal rate, regular rhythm and normal heart sounds.  No murmur heard. Pulmonary/Chest: Effort normal and breath sounds normal. No respiratory distress. She has no wheezes.  Abdominal: Soft. Bowel sounds are normal. She exhibits no distension. There is no abdominal tenderness.  Musculoskeletal:        General: No tenderness, deformity or edema.     Comments: Decreased ROM of right shoulder, positive empty can test of right shoulder  Neurological: She is alert and oriented to person, place, and time.  Skin: Skin is warm and dry.  Psychiatric: Mood, memory, affect and judgment normal.    Assessment & Plan:   See Encounters Tab for problem based charting.  Patient seen with Dr. Cleda DaubE. Contreras

## 2018-05-20 NOTE — Assessment & Plan Note (Signed)
Patient reported that she has had right shoulder pain for approximately 2 months.  She works at [employer] and her job is physically demanding.  She is constantly lifting heavy boxes and has noticed that she has been struggling with this for about the past 2 months.  She has pain with flexion of her right shoulder and is unable to lift it more than 90 degrees.  She said the pain improves with rest.  She has not tried any medications or over-the-counter ointments or gels.  On exam she had limited range of motion and a positive empty can test.  Point-of-care ultrasound of the right shoulder showed some impingement as well as inflammation.  Patient agreed to trying Voltaren gel up to 4 times daily.  I also recommended getting a steroid injection given her demanding work however she would like to try conservative management first.   Plan: - Voltaren gel up to 4 times daily on right shoulder joint - Consider steroid injection if pain does not improve

## 2018-05-20 NOTE — Patient Instructions (Signed)
[name],  It was a pleasure meeting you today. I am sorry about your shoulder pain. I want you to try using Voltaren gel up to four times daily on your right shoulder. If this does not help your pain I want you to consider a steroid injection.  Your high blood pressure, I want you to increase your amlodipine to 5 mg daily along with your chlorthalidone 25 mg.  I have sent in prescriptions for both your medications to your pharmacy.  I want you to follow-up in 4 weeks to recheck your blood pressure.  I hope you feel better and happy holidays!

## 2018-05-20 NOTE — Assessment & Plan Note (Signed)
BP 166/70 today.  Patient is requesting refills on chlorthalidone 25 mg and amlodipine 2.5 mg.  Her amlodipine dose to 5 mg to see if this helps her blood pressure.  Patient amenable to this plan and will increase amlodipine to 5 mg today and follow-up in 4 weeks for BP recheck.  Lan: -Continue chlorthalidone 25 mg daily -Increase amlodipine to 5 mg daily -Follow-up in 4 weeks for BP recheck

## 2018-05-20 NOTE — Assessment & Plan Note (Signed)
Refilled atorvastatin 40 mg today.  

## 2018-05-21 ENCOUNTER — Telehealth: Payer: Self-pay | Admitting: *Deleted

## 2018-05-21 NOTE — Progress Notes (Signed)
Internal Medicine Clinic Attending  I saw and evaluated the patient.  I personally confirmed the key portions of the history and exam documented by Dr.  Rehman  and I reviewed pertinent patient test results.  The assessment, diagnosis, and plan were formulated together and I agree with the documentation in the resident's note.  

## 2018-05-21 NOTE — Telephone Encounter (Addendum)
Information was sent through CoverMyMeds for Diclofenac Gel.Awaiting decision from [employer],   [name], RN 05/21/2018  10:15 AM  PA for Diclofenac Gel approved 05/21/2018 thru 05/19/2021.  [name], RN 05/22/2018 8:43 AM.

## 2018-06-11 ENCOUNTER — Other Ambulatory Visit: Payer: Self-pay

## 2018-06-11 ENCOUNTER — Ambulatory Visit: Payer: BLUE CROSS/BLUE SHIELD | Admitting: Internal Medicine

## 2018-06-11 ENCOUNTER — Encounter: Payer: Self-pay | Admitting: Internal Medicine

## 2018-06-11 VITALS — BP 150/74 | HR 85 | Temp 97.9°F | Ht 67.0 in | Wt 165.4 lb

## 2018-06-11 DIAGNOSIS — Z23 Encounter for immunization: Secondary | ICD-10-CM | POA: Diagnosis not present

## 2018-06-11 DIAGNOSIS — H109 Unspecified conjunctivitis: Secondary | ICD-10-CM

## 2018-06-11 DIAGNOSIS — Z79899 Other long term (current) drug therapy: Secondary | ICD-10-CM | POA: Diagnosis not present

## 2018-06-11 DIAGNOSIS — I10 Essential (primary) hypertension: Secondary | ICD-10-CM

## 2018-06-11 MED ORDER — AMLODIPINE BESYLATE 10 MG PO TABS: 10.0000 mg | ORAL_TABLET | Freq: Every day | ORAL | 3 refills | Status: DC

## 2018-06-11 MED ORDER — DOUBLE ANTIBIOTIC 500-10000 UNIT/GM EX OINT: 1.0000 "application " | TOPICAL_OINTMENT | Freq: Four times a day (QID) | CUTANEOUS | 1 refills | Status: DC

## 2018-06-11 NOTE — Progress Notes (Signed)
   CC: "Pink Eye"  HPI:[name] is a 63 y.o. female who presents for evaluation of pink eye. Please see individual problem based A/P for details.  Influenza vaccine: Agreed and administered.  PHQ-9: Based on the patients    Office Visit from 06/11/2018 in [facility]  PHQ-9 Total Score  0     score we have decided to monitor.  Past Medical History:  Diagnosis Date  . Hypertension    Review of Systems:  ROS negative except as per HPI.  Physical Exam: Vitals:   06/11/18 1319  BP: (!) 150/74  Pulse: 85  Temp: 97.9 F (36.6 C)  TempSrc: Oral  SpO2: 94%  Weight: 165 lb 6.4 oz (75 kg)  Height: 5\' 7"  (1.702 m)   General: A/O x4, in no acute distress, afebrile, nondiaphoretic HEENT: Left eye appears injected absent notable focus, scleritis or hemorrhage, there is minimal discharge noted but the patient has recently cleaned the eye, there is no associated oral or nasal involvement.   Assessment & Plan:   See Encounters Tab for problem based charting.  Patient discussed with Dr. Rogelia Boga

## 2018-06-11 NOTE — Assessment & Plan Note (Signed)
Left eye conjunctivitis: Three day history of left eye redness, thick mucus production, waking with her eye "stuck" together, following a recent URI with nasal and oral involvement with all other symptoms resolving. She denied vision loss, peripheral field changes, blurry vision, inability to move her eye, fever, chills, headache, ear pain, or difficulty swallowing. She endorses mild nausea today. The majority of her symptoms indicate a bacterial conjunctivitis.   Plan: As the pain is described as worse after she began rubbing it out daily and appears secondary to the original process I am slightly less concerned with this aspect and will treat as a bacterial conjunctivitis.  We will initiate treatment with polymixin B - bacitracin drops QID for 7 - 10 days. She has been given strict return precautions.

## 2018-06-11 NOTE — Patient Instructions (Signed)
FOLLOW-UP INSTRUCTIONS When: 2-4 weeks For: Routine visit with Dr. Mikey Bussing What to bring: All of your medications   I have added an eye drop to your medications today. Please place this in your eye four times per day for at least seven days and up to ten days if your symptoms persist at day 7. Please notify us if your symptoms do not improve or worsen. Please use ibuprofen for the eye discomfort at no more than 400mg  three times a day.  If you lose vision in your eye after clearing it, the pain increases, or your symptoms do not resolve soon, please let us know.  Thank you for your visit to the [facility] today. If you have any questions or concerns please call us at [phone].

## 2018-06-11 NOTE — Progress Notes (Signed)
Internal Medicine Clinic Attending  Case discussed with Dr. Harbrecht at the time of the visit.  We reviewed the resident's history and exam and pertinent patient test results.  I agree with the assessment, diagnosis, and plan of care documented in the resident's note.   

## 2018-06-11 NOTE — Assessment & Plan Note (Signed)
HTN: Patient agreed to increase her Amlodipine to 10mg  daily due to her BP of 150/74 and her admission to compliance with the 5mg  dose.Marland Kitchen She was advised to return to see her PCP in 2-4 weeks.

## 2018-09-03 ENCOUNTER — Telehealth: Payer: Self-pay | Admitting: Internal Medicine
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
## 2018-09-03 NOTE — Telephone Encounter (Signed)
Pt is having a mental breakdown. She is needing a note to be out of work for 2 weeks; pt contact [phone]

## 2018-09-04 NOTE — Telephone Encounter (Signed)
Work note sounds reasonable, if available a tele health visit would be ideal for documentation.

## 2018-09-04 NOTE — Telephone Encounter (Signed)
Talked to pt - agreeable to tele health visit. Appt scheduled tomorrow in [facility] @ 1445 PM. Also mentioned to pt of Phineas Semen 's services if she needs someone to talk to.

## 2018-09-05 ENCOUNTER — Encounter: Payer: Self-pay | Admitting: Internal Medicine

## 2018-09-05 ENCOUNTER — Ambulatory Visit (INDEPENDENT_AMBULATORY_CARE_PROVIDER_SITE_OTHER): Payer: BLUE CROSS/BLUE SHIELD | Admitting: Internal Medicine

## 2018-09-05 ENCOUNTER — Other Ambulatory Visit: Payer: Self-pay

## 2018-09-05 DIAGNOSIS — Z634 Disappearance and death of family member: Secondary | ICD-10-CM

## 2018-09-05 NOTE — Progress Notes (Signed)
Internal Medicine Clinic Attending  Case discussed with Dr. Helberg at the time of the visit.  We reviewed the resident's history and exam and pertinent patient test results.  I agree with the assessment, diagnosis, and plan of care documented in the resident's note.    

## 2018-09-05 NOTE — Assessment & Plan Note (Signed)
HPI: Multiple stressors, 5 family members with cancer and 1 sister in rehab after CVA. Aunt died last night. Works at [employer] and has been overwhelmed lately. She is very anxious. Feels disrespected by her employer/manager. Spoke with HR manager and they recommended she take a couple weeks off. Hasn't been to work since 09/03/2018. No feelings of SI.   A/P: Does not meet diagnostic criteria for MDD or GAD. Likely Bereavement. Will give work note for 2 weeks off. Will fax note to HR manager, Eustace Moore at [facility] [phone].

## 2018-09-05 NOTE — Progress Notes (Signed)
   CC: Grief  This is a telephone encounter between ZAMARAH QUIST and [employer] on 09/05/2018 for grief/stress. The visit was conducted with the patient located at home and [facility]. The patient's identity was confirmed using their DOB and current address. The patient has consented to being evaluated through a telephone encounter and understands the associated risks (an examination cannot be done and the patient may need to come in for an appointment) / benefits (allows the patient to remain at home, decreasing exposure to coronavirus). I personally spent 7 minutes on medical discussion.   HPI:  [name] is a 63 y.o. female with PMHx listed below presenting for grief/stress. Please see the A&P for the status of the patient's chronic medical problems.  Past Medical History:  Diagnosis Date  . Hypertension    Review of Systems: Performed and all others negative.  Assessment & Plan:   See Encounters Tab for problem based charting.  Patient discussed with Dr. Rogelia Boga

## 2019-01-05 ENCOUNTER — Other Ambulatory Visit: Payer: Self-pay

## 2019-01-05 ENCOUNTER — Telehealth: Payer: Self-pay

## 2019-01-05 DIAGNOSIS — R6889 Other general symptoms and signs: Secondary | ICD-10-CM | POA: Diagnosis not present

## 2019-01-05 DIAGNOSIS — Z20822 Contact with and (suspected) exposure to covid-19: Secondary | ICD-10-CM

## 2019-01-05 NOTE — Telephone Encounter (Signed)
Return pt's call - stated she was at the bank, became dizzy and nauseated. Denies fever,sob,cp. Wants to be tested - instructed to go to [facility] site and to continue social distancing/ handwashing. Verbalized understanding.

## 2019-01-05 NOTE — Telephone Encounter (Signed)
Pt would like to be test for COVID-19. Please call pt back.

## 2019-01-06 LAB — NOVEL CORONAVIRUS, NAA: SARS-CoV-2, NAA: NOT DETECTED

## 2019-04-19 IMAGING — MG 2D DIGITAL DIAGNOSTIC UNILATERAL LEFT MAMMOGRAM WITH CAD AND ADJ
6 series · 6 of 14 positions shown · non-contrast
Comparison: Previous exam(s).

CLINICAL DATA: 60-year-old female recalled from baseline screening
mammogram dated 03/07/2017 for a possible left breast mass.

EXAM:
2D DIGITAL DIAGNOSTIC LEFT MAMMOGRAM WITH CAD AND ADJUNCT TOMO
ULTRASOUND LEFT BREAST

[L MLO synth-2D]
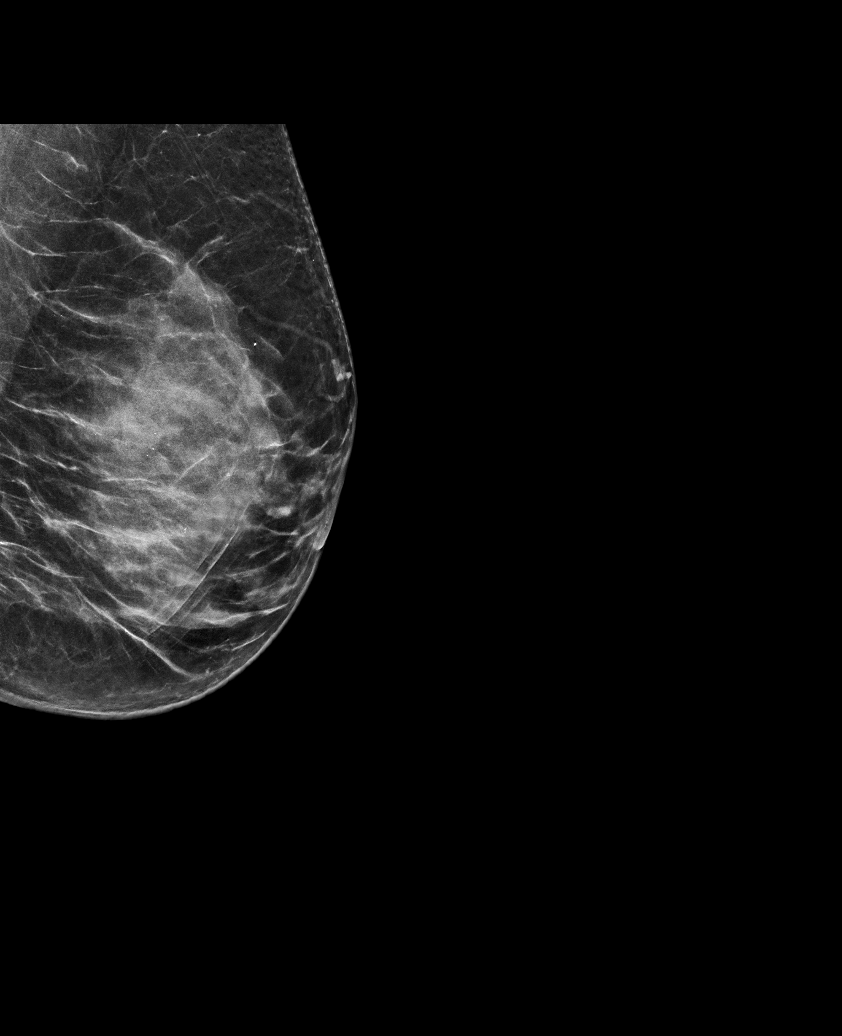

[L CC synth-2D]
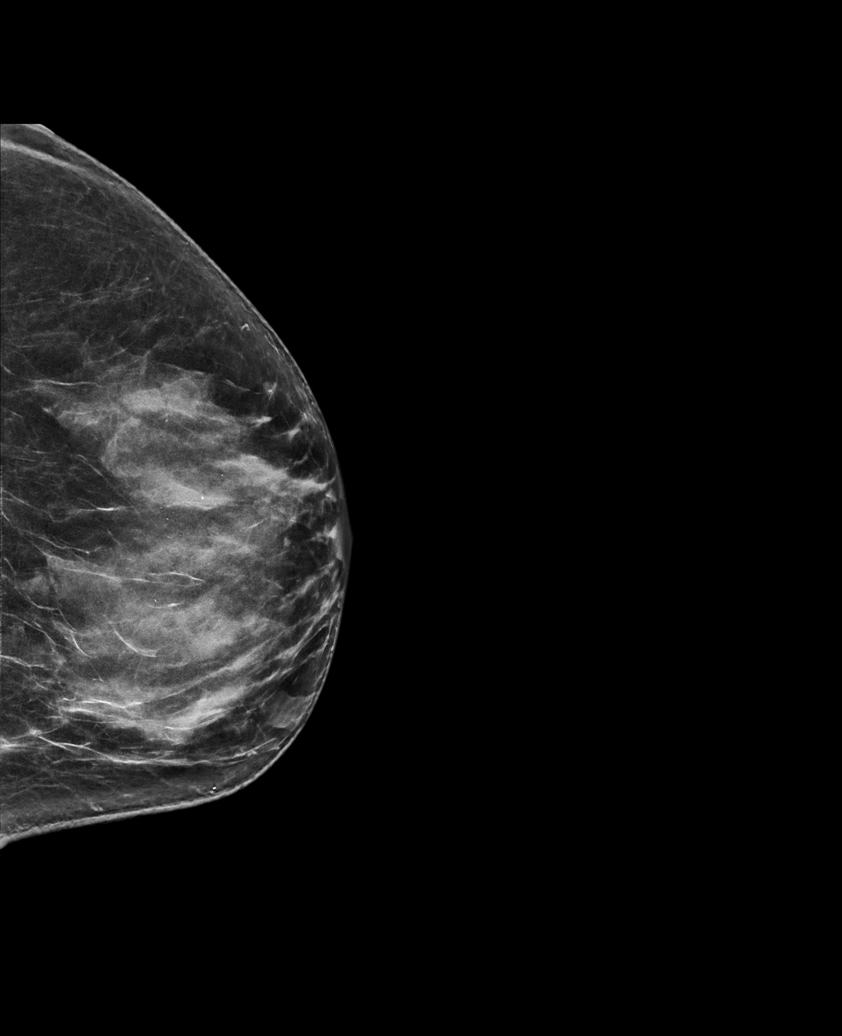

[L MLO]
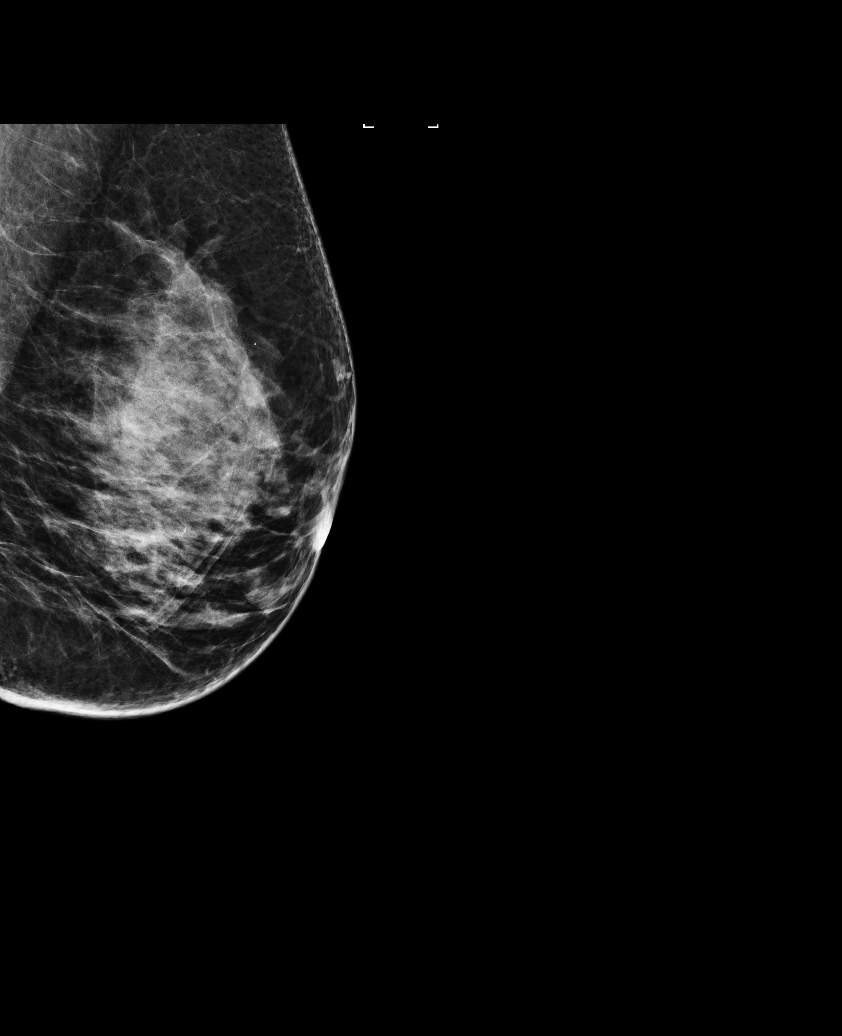

[L CC]
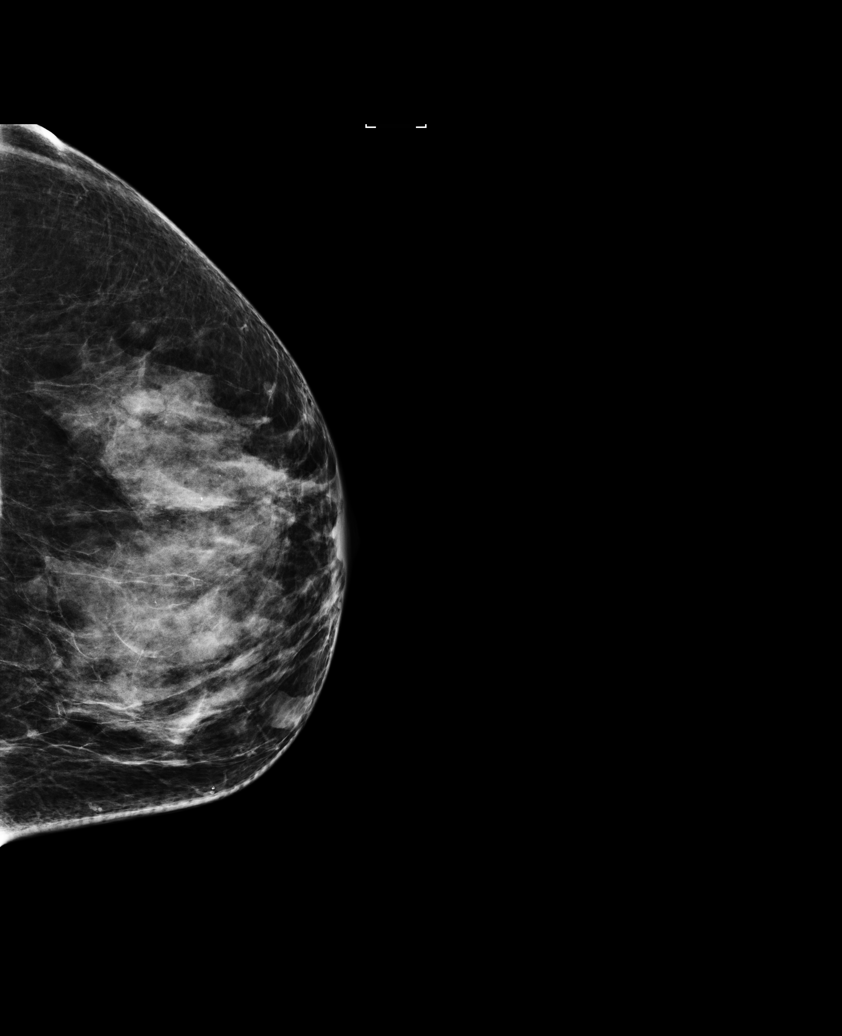

[L CC tomo · tomo slice 36/71.0]
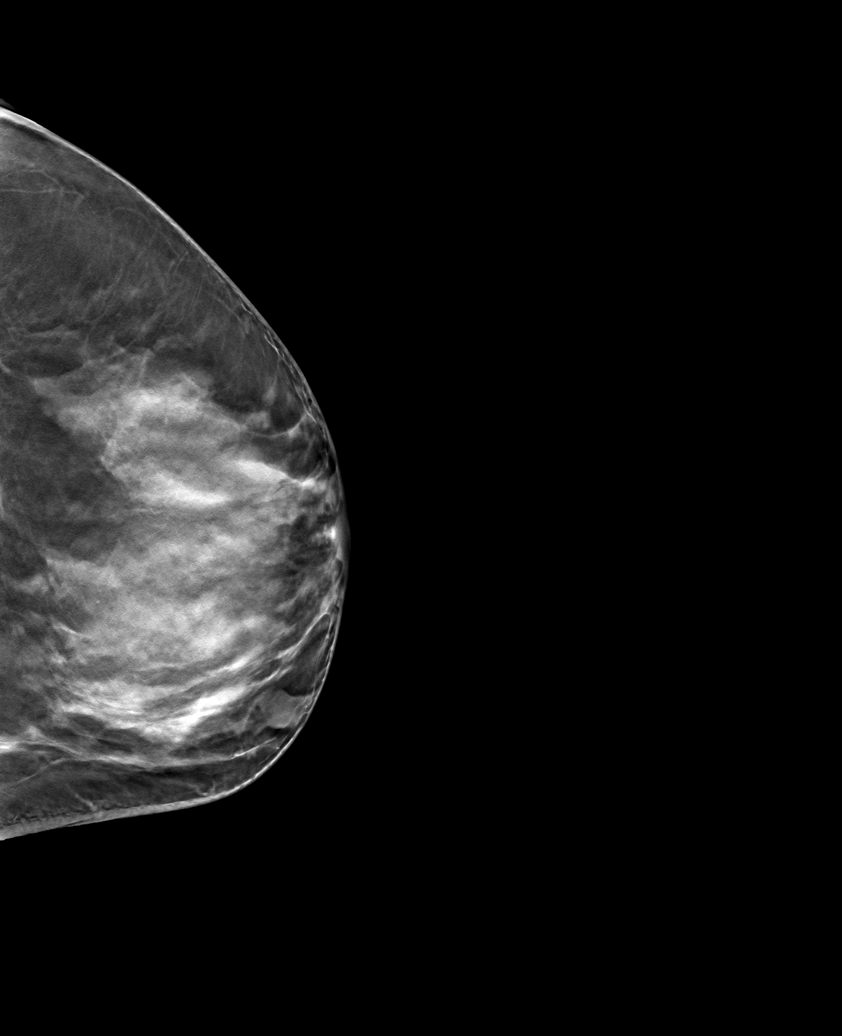

[L MLO tomo · tomo slice 36/71.0]
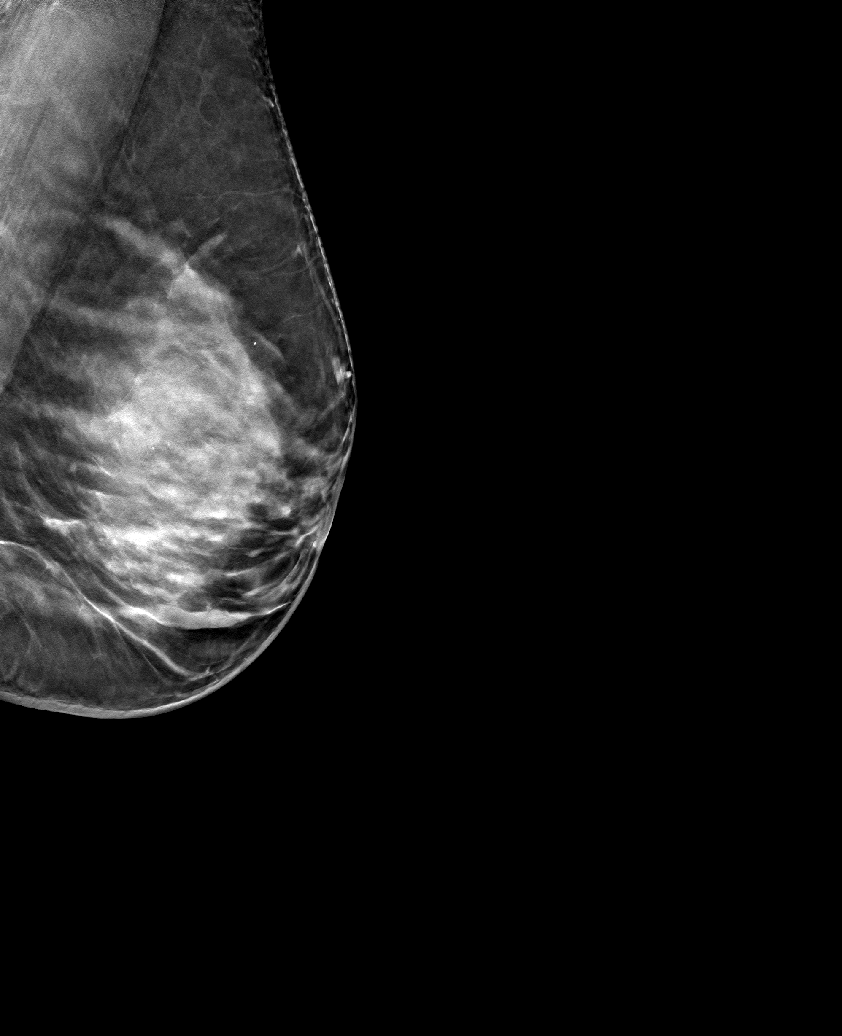

[6 of 14 positions shown; findings below may reference images not displayed]

ACR Breast Density Category c: The breast tissue is heterogeneously
dense, which may obscure small masses.
FINDINGS: An oval, circumscribed equal density mass persists in the lateral
left breast on today's 3D views. It is not well identified on the
MLO projection but localizes inferiorly on tomosynthesis. Additional
evaluation with ultrasound was performed.

Mammographic images were processed with CAD.

Targeted ultrasound is performed, showing an oval, parallel
hypoechoic mass with circumscribed borders at the [DATE] position 4 cm
from the nipple. It measures 1.3 x 1.1 x 0.6 cm. This most likely
corresponds with the mammographic finding. Note is made of
peripheral vascularity.
IMPRESSION: Probably benign left breast mass, likely representing a
fibroadenoma. Recommendation is for six-month follow-up.

RECOMMENDATION:
Diagnostic left breast mammogram and ultrasound in 6 months.

I have discussed the findings and recommendations with the patient.
Results were also provided in writing at the conclusion of the
visit. If applicable, a reminder letter will be sent to the patient
regarding the next appointment.

BI-RADS CATEGORY  3: Probably benign.

## 2019-05-11 ENCOUNTER — Other Ambulatory Visit: Payer: Self-pay | Admitting: *Deleted

## 2019-05-11 DIAGNOSIS — I10 Essential (primary) hypertension: Secondary | ICD-10-CM

## 2019-05-11 MED ORDER — CHLORTHALIDONE 25 MG PO TABS: 25.0000 mg | ORAL_TABLET | Freq: Every day | ORAL | 1 refills | Status: DC

## 2019-05-18 ENCOUNTER — Other Ambulatory Visit: Payer: Self-pay | Admitting: *Deleted

## 2019-05-18 DIAGNOSIS — E782 Mixed hyperlipidemia: Secondary | ICD-10-CM

## 2019-05-18 MED ORDER — ATORVASTATIN CALCIUM 40 MG PO TABS: 40.0000 mg | ORAL_TABLET | Freq: Every day | ORAL | 3 refills | Status: DC

## 2019-05-31 ENCOUNTER — Other Ambulatory Visit: Payer: Self-pay | Admitting: Internal Medicine

## 2019-05-31 DIAGNOSIS — I10 Essential (primary) hypertension: Secondary | ICD-10-CM

## 2019-08-08 ENCOUNTER — Ambulatory Visit: Payer: Self-pay | Attending: Internal Medicine

## 2019-08-08 DIAGNOSIS — Z23 Encounter for immunization: Secondary | ICD-10-CM | POA: Insufficient documentation

## 2019-08-08 NOTE — Progress Notes (Signed)
   Covid-19 Vaccination Clinic  Name:  ARENA LINDAHL    MRN: [id number] DOB: 04/02/1956  08/08/2019  Ms. Schaad was observed post Covid-19 immunization for 15 minutes without incident. She was provided with Vaccine Information Sheet and instruction to access the V-Safe system.   Ms. Xin was instructed to call 911 with any severe reactions post vaccine: Marland Kitchen Difficulty breathing  . Swelling of face and throat  . A fast heartbeat  . A bad rash all over body  . Dizziness and weakness   Immunizations Administered    Name Date Dose VIS Date Route   Pfizer COVID-19 Vaccine 08/08/2019 10:56 AM 0.3 mL 05/15/2019 Intramuscular   Manufacturer: [employer], [employer]   Lot: [id number]   NDC: [address]-4

## 2019-08-29 ENCOUNTER — Ambulatory Visit: Payer: Self-pay | Attending: Internal Medicine

## 2019-08-29 DIAGNOSIS — Z23 Encounter for immunization: Secondary | ICD-10-CM

## 2019-08-29 NOTE — Progress Notes (Signed)
   Covid-19 Vaccination Clinic  Name:  Laurie Contreras    MRN: [id number] DOB: 10/27/1955  08/29/2019  Laurie Contreras was observed post Covid-19 immunization for 15 minutes without incident. She was provided with Vaccine Information Sheet and instruction to access the V-Safe system.   Laurie Contreras was instructed to call 911 with any severe reactions post vaccine: Marland Kitchen Difficulty breathing  . Swelling of face and throat  . A fast heartbeat  . A bad rash all over body  . Dizziness and weakness   Immunizations Administered    Name Date Dose VIS Date Route   Pfizer COVID-19 Vaccine 08/29/2019  8:43 AM 0.3 mL 05/15/2019 Intramuscular   Manufacturer: [employer], [employer]   Lot: [id number]   NDC: [address]-7

## 2019-10-06 ENCOUNTER — Other Ambulatory Visit: Payer: Self-pay

## 2019-10-06 DIAGNOSIS — I10 Essential (primary) hypertension: Secondary | ICD-10-CM

## 2019-10-06 NOTE — Telephone Encounter (Signed)
amLODipine (NORVASC) 10 MG tablet   chlorthalidone (HYGROTON) 25 MG tablet, refill request @  [id number] - [place], [place] - [address] AT [address] [phone] (Phone) [phone] (Fax)

## 2019-10-06 NOTE — Telephone Encounter (Signed)
Has appointment to be seen tomorrow, may be best to assess her then before sending in these Rx in case need for change

## 2019-10-07 ENCOUNTER — Encounter: Payer: Self-pay | Admitting: Internal Medicine

## 2019-10-07 ENCOUNTER — Other Ambulatory Visit: Payer: Self-pay

## 2019-10-07 ENCOUNTER — Ambulatory Visit: Payer: BC Managed Care – PPO | Admitting: Internal Medicine

## 2019-10-07 VITALS — BP 145/74 | HR 79 | Temp 98.3°F | Ht 67.0 in | Wt 175.0 lb

## 2019-10-07 DIAGNOSIS — Z0000 Encounter for general adult medical examination without abnormal findings: Secondary | ICD-10-CM | POA: Diagnosis not present

## 2019-10-07 DIAGNOSIS — N6320 Unspecified lump in the left breast, unspecified quadrant: Secondary | ICD-10-CM | POA: Diagnosis not present

## 2019-10-07 DIAGNOSIS — I10 Essential (primary) hypertension: Secondary | ICD-10-CM

## 2019-10-07 DIAGNOSIS — E782 Mixed hyperlipidemia: Secondary | ICD-10-CM

## 2019-10-07 DIAGNOSIS — J302 Other seasonal allergic rhinitis: Secondary | ICD-10-CM

## 2019-10-07 LAB — POCT GLYCOSYLATED HEMOGLOBIN (HGB A1C): Hemoglobin A1C: 6.6 % — AB (ref 4.0–5.6)

## 2019-10-07 LAB — GLUCOSE, CAPILLARY: Glucose-Capillary: 107 mg/dL — ABNORMAL HIGH (ref 70–99)

## 2019-10-07 MED ORDER — AMLODIPINE BESYLATE 10 MG PO TABS: ORAL_TABLET | ORAL | 1 refills | Status: DC

## 2019-10-07 MED ORDER — DIPHENHYDRAMINE HCL 25 MG PO CAPS: 50.0000 mg | ORAL_CAPSULE | Freq: Four times a day (QID) | ORAL | 1 refills | Status: DC | PRN

## 2019-10-07 MED ORDER — CHLORTHALIDONE 25 MG PO TABS: 25.0000 mg | ORAL_TABLET | Freq: Every day | ORAL | 1 refills | Status: DC

## 2019-10-07 NOTE — Assessment & Plan Note (Addendum)
Prior history of left breast mass noted to be probable fibroadenoma in 2018 and recommended for diagnostic mammogram and [place] in 6 months. Patient has not been able to follow up on this. Given significant history of cancer in her family, would recommend patient for repeat mammogram.   Plan:  Referral to breast clinic for mammogram

## 2019-10-07 NOTE — Patient Instructions (Addendum)
Laurie Contreras,  It was a pleasure seeing you in clinic. Today, we discussed:   Hypertension: I have refilled your blood pressure medications. Please take these as prescribed. I am also getting some blood work today. I will let you know of the results.   Screening exams: Please schedule a mammogram and pap smear at your earliest convenience.    Please contact us if you have any questions or concerns.  Thank you!

## 2019-10-07 NOTE — Assessment & Plan Note (Signed)
Patient with history of sessile colonic polyp in 2018 - recommend for repeat colonoscopy in 3-5 years; will discuss with patient at next visit to f/u with GI for this   Pap smear - patient notes it has "been a while" since she has had a pap smear; deferred by patient at this visit  Mammogram - left breast mass in 2018 noted to have probable benign mass with ?fibroadenoma; recommended for diagnostic mammogram and [place] in 6 months; discussed with patient and referral to breast clinic placed.

## 2019-10-07 NOTE — Assessment & Plan Note (Signed)
BP 145/74 at this visit. Patient notes that she has been out of her medications for two weeks. She denies any headaches, vision changes, dizziness, nausea/vomiting, chest pain or shortness of breath.   Plan: Refilled amlodipine 10mg  daily and chlorthalidone 25mg  daily BMP today

## 2019-10-07 NOTE — Progress Notes (Signed)
   CC: hypertension f/u  HPI:  [name] is a 63 y.o. female with PMHx of hypertension and hyperlipidemia presenting for f/u of hypertension. No acute concerns at this time. Please see problem based charting for further A/P.   Past Medical History:  Diagnosis Date  . Hypertension    Review of Systems:  Negative except as stated in HPI.   Physical Exam:  Vitals:   10/07/19 0948  BP: (!) 145/74  Pulse: 79  Temp: 98.3 F (36.8 C)  TempSrc: Oral  SpO2: 95%  Weight: 175 lb (79.4 kg)  Height: 5\' 7"  (1.702 m)   Physical Exam Constitutional:      General: She is not in acute distress.    Appearance: Normal appearance. She is not diaphoretic.  HENT:     Head: Normocephalic and atraumatic.     Mouth/Throat:     Mouth: Mucous membranes are moist.     Pharynx: Oropharynx is clear.  Eyes:     General: No scleral icterus.    Extraocular Movements: Extraocular movements intact.     Conjunctiva/sclera: Conjunctivae normal.  Cardiovascular:     Rate and Rhythm: Normal rate and regular rhythm.     Pulses: Normal pulses.     Heart sounds: Normal heart sounds. No murmur. No friction rub. No gallop.   Pulmonary:     Effort: Pulmonary effort is normal. No respiratory distress.     Breath sounds: Normal breath sounds. No wheezing or rales.  Abdominal:     General: Abdomen is flat. Bowel sounds are normal. There is no distension.     Palpations: Abdomen is soft.     Tenderness: There is no abdominal tenderness.  Musculoskeletal:        General: No swelling or tenderness. Normal range of motion.  Skin:    General: Skin is warm and dry.     Capillary Refill: Capillary refill takes less than 2 seconds.  Neurological:     General: No focal deficit present.     Mental Status: She is alert and oriented to person, place, and time. Mental status is at baseline.     Cranial Nerves: No cranial nerve deficit.     Motor: No weakness.      Assessment & Plan:   See Encounters Tab  for problem based charting.  Patient discussed with Dr. 

## 2019-10-07 NOTE — Assessment & Plan Note (Signed)
Lipitor 40mg  daily   Plan: Lipid panel today Continue lipitor 40mg  daily

## 2019-10-07 NOTE — Addendum Note (Signed)
Addended by: Eliezer Bottom on: 10/07/2019 04:08 PM   Modules accepted: Orders

## 2019-10-08 ENCOUNTER — Other Ambulatory Visit: Payer: Self-pay | Admitting: Internal Medicine

## 2019-10-08 DIAGNOSIS — N6320 Unspecified lump in the left breast, unspecified quadrant: Secondary | ICD-10-CM

## 2019-10-08 LAB — LIPID PANEL
Chol/HDL Ratio: 5.4 ratio — ABNORMAL HIGH (ref 0.0–4.4)
Cholesterol, Total: 227 mg/dL — ABNORMAL HIGH (ref 100–199)
HDL: 42 mg/dL (ref >39)
LDL Chol Calc (NIH): 164 mg/dL — ABNORMAL HIGH (ref 0–99)
Triglycerides: 118 mg/dL (ref 0–149)
VLDL Cholesterol Cal: 21 mg/dL (ref 5–40)

## 2019-10-08 LAB — CBC
Hematocrit: 36.5 % (ref 34.0–46.6)
Hemoglobin: 12.5 g/dL (ref 11.1–15.9)
MCH: 27.9 pg (ref 26.6–33.0)
MCHC: 34.2 g/dL (ref 31.5–35.7)
MCV: 82 fL (ref 79–97)
Platelets: 283 10*3/uL (ref 150–450)
RBC: 4.48 x10E6/uL (ref 3.77–5.28)
RDW: 16.4 % — ABNORMAL HIGH (ref 11.7–15.4)
WBC: 6.3 10*3/uL (ref 3.4–10.8)

## 2019-10-08 LAB — BMP8+ANION GAP
Anion Gap: 15 mmol/L (ref 10.0–18.0)
BUN/Creatinine Ratio: 13 (ref 12–28)
BUN: 11 mg/dL (ref 8–27)
CO2: 21 mmol/L (ref 20–29)
Calcium: 10.5 mg/dL — ABNORMAL HIGH (ref 8.7–10.3)
Chloride: 104 mmol/L (ref 96–106)
Creatinine, Ser: 0.84 mg/dL (ref 0.57–1.00)
GFR calc Af Amer: 85 mL/min/{1.73_m2} (ref >59)
GFR calc non Af Amer: 74 mL/min/{1.73_m2} (ref >59)
Glucose: 100 mg/dL — ABNORMAL HIGH (ref 65–99)
Potassium: 3.5 mmol/L (ref 3.5–5.2)
Sodium: 140 mmol/L (ref 134–144)

## 2019-10-09 NOTE — Progress Notes (Signed)
Internal Medicine Clinic Attending  Case discussed with Dr. Aslam at the time of the visit.  We reviewed the resident's history and exam and pertinent patient test results.  I agree with the assessment, diagnosis, and plan of care documented in the resident's note.  

## 2019-10-14 ENCOUNTER — Other Ambulatory Visit: Payer: BC Managed Care – PPO

## 2019-10-28 ENCOUNTER — Other Ambulatory Visit: Payer: Self-pay

## 2019-10-28 ENCOUNTER — Ambulatory Visit
Admission: RE | Admit: 2019-10-28 | Discharge: 2019-10-28 | Disposition: A | Discharge status: Home / Self Care | Payer: BC Managed Care – PPO | Source: Ambulatory Visit | Attending: Internal Medicine | Admitting: Internal Medicine

## 2019-10-28 DIAGNOSIS — N6324 Unspecified lump in the left breast, lower inner quadrant: Secondary | ICD-10-CM | POA: Diagnosis not present

## 2019-10-28 DIAGNOSIS — R922 Inconclusive mammogram: Secondary | ICD-10-CM | POA: Diagnosis not present

## 2019-10-28 DIAGNOSIS — N6489 Other specified disorders of breast: Secondary | ICD-10-CM | POA: Diagnosis not present

## 2019-10-28 DIAGNOSIS — N6320 Unspecified lump in the left breast, unspecified quadrant: Secondary | ICD-10-CM

## 2019-10-28 DIAGNOSIS — N6321 Unspecified lump in the left breast, upper outer quadrant: Secondary | ICD-10-CM | POA: Diagnosis not present

## 2019-10-28 NOTE — Progress Notes (Signed)
Attempted call to patient to discuss results of mammogram. Stable mass in left breast - benign. Rec screening mammogram in 1 yr. Lipid panel with elevated TC and LDL - rec continue lipitor 40mg  daily

## 2019-12-20 ENCOUNTER — Other Ambulatory Visit: Payer: Self-pay

## 2019-12-20 ENCOUNTER — Ambulatory Visit (HOSPITAL_COMMUNITY)
Admission: EM | Admit: 2019-12-20 | Discharge: 2019-12-20 | Disposition: A | Discharge status: Home / Self Care | Payer: BC Managed Care – PPO | Attending: Urgent Care | Admitting: Urgent Care

## 2019-12-20 ENCOUNTER — Encounter (HOSPITAL_COMMUNITY): Payer: Self-pay

## 2019-12-20 DIAGNOSIS — Z20822 Contact with and (suspected) exposure to covid-19: Secondary | ICD-10-CM | POA: Diagnosis not present

## 2019-12-20 LAB — SARS CORONAVIRUS 2 (TAT 6-24 HRS): SARS Coronavirus 2: NEGATIVE

## 2019-12-20 NOTE — ED Provider Notes (Signed)
[facility]   MRN: [id number] DOB: [birth year]  Subjective:   Laurie Contreras is a 64 y.o. female presenting for COVID-19 testing.  Patient states that her coworker tested positive and she was in close proximity with her.  She has had her Covid vaccination.  No current facility-administered medications for this encounter.  Current Outpatient Medications:  .  amLODipine (NORVASC) 10 MG tablet, TAKE 1 TABLET(10 MG) BY MOUTH DAILY, Disp: 180 tablet, Rfl: 1 .  atorvastatin (LIPITOR) 40 MG tablet, Take 1 tablet (40 mg total) by mouth daily., Disp: 90 tablet, Rfl: 3 .  chlorthalidone (HYGROTON) 25 MG tablet, Take 1 tablet (25 mg total) by mouth daily., Disp: 90 tablet, Rfl: 1 .  Multiple Vitamin (MULTIVITAMIN WITH MINERALS) TABS tablet, Take 1 tablet by mouth daily., Disp: , Rfl:  .  diphenhydrAMINE (BENADRYL) 25 mg capsule, Take 2 capsules (50 mg total) by mouth every 6 (six) hours as needed for allergies., Disp: 30 capsule, Rfl: 1   Allergies  Allergen Reactions  . Naprosyn [Naproxen]     Past Medical History:  Diagnosis Date  . Hypertension      Past Surgical History:  Procedure Laterality Date  . TONSILLECTOMY      Family History  Problem Relation Age of Onset  . Hypertension Mother   . Stroke Mother   . Hypertension Father   . Stroke Father   . Heart attack Sister   . Breast cancer Neg Hx     Social History   Tobacco Use  . Smoking status: Current Every Day Smoker    Packs/day: 0.50    Years: 40.00    Pack years: 20.00    Types: Cigarettes  . Smokeless tobacco: Never Used  . Tobacco comment: Cutting back  Substance Use Topics  . Alcohol use: Yes    Alcohol/week: 1.0 standard drink    Types: 1 Cans of beer per week    Comment: Occasioally.  . Drug use: No    Review of Systems  Constitutional: Negative for fever and malaise/fatigue.  HENT: Negative for congestion, ear pain, sinus pain and sore throat.   Eyes: Negative for discharge and redness.    Respiratory: Negative for cough, hemoptysis, shortness of breath and wheezing.   Cardiovascular: Negative for chest pain.  Gastrointestinal: Negative for abdominal pain, diarrhea, nausea and vomiting.  Genitourinary: Negative for dysuria, flank pain and hematuria.  Musculoskeletal: Negative for myalgias.  Skin: Negative for rash.  Neurological: Negative for dizziness, weakness and headaches.  Psychiatric/Behavioral: Negative for depression and substance abuse.     Objective:   Vitals: BP 139/73 (BP Location: Left Arm)   Pulse 87   Temp 98.3 F (36.8 C) (Oral)   Resp 18   SpO2 98%   Physical Exam Constitutional:      General: She is not in acute distress.    Appearance: Normal appearance. She is well-developed. She is not ill-appearing, toxic-appearing or diaphoretic.  HENT:     Head: Normocephalic and atraumatic.     Nose: Nose normal.     Mouth/Throat:     Mouth: Mucous membranes are moist.  Eyes:     Extraocular Movements: Extraocular movements intact.     Pupils: Pupils are equal, round, and reactive to light.  Cardiovascular:     Rate and Rhythm: Normal rate and regular rhythm.     Pulses: Normal pulses.     Heart sounds: Normal heart sounds. No murmur heard.  No friction rub. No gallop.  Pulmonary:     Effort: Pulmonary effort is normal. No respiratory distress.     Breath sounds: Normal breath sounds. No stridor. No wheezing, rhonchi or rales.  Skin:    General: Skin is warm and dry.     Findings: No rash.  Neurological:     Mental Status: She is alert and oriented to person, place, and time.  Psychiatric:        Mood and Affect: Mood normal.        Behavior: Behavior normal.        Thought Content: Thought content normal.       Assessment and Plan :   PDMP not reviewed this encounter.  1. Close exposure to COVID-19 virus     Counseled patient on nature of COVID-19 including modes of transmission, diagnostic testing, management and supportive  care.  Counseled on medications used for symptomatic relief. COVID 19 testing is pending. Counseled patient on potential for adverse effects with medications prescribed/recommended today, ER and return-to-clinic precautions discussed, patient verbalized understanding.     Wallis Bamberg, PA-C 12/20/19 1204

## 2019-12-20 NOTE — ED Triage Notes (Signed)
Pt states she was informed on Friday that a co-worker tested positive for COVID. Pt denies cough, congestion, runny nose, sore throat, abdominal pain, n/v/d, fever, chills or other c/os.

## 2019-12-20 NOTE — Discharge Instructions (Signed)

## 2020-06-30 ENCOUNTER — Other Ambulatory Visit: Payer: Self-pay | Admitting: *Deleted

## 2020-06-30 DIAGNOSIS — E782 Mixed hyperlipidemia: Secondary | ICD-10-CM

## 2020-06-30 MED ORDER — ATORVASTATIN CALCIUM 40 MG PO TABS: 40.0000 mg | ORAL_TABLET | Freq: Every day | ORAL | 3 refills | Status: DC

## 2020-07-27 ENCOUNTER — Other Ambulatory Visit: Payer: Self-pay

## 2020-07-27 ENCOUNTER — Encounter: Payer: Self-pay | Admitting: Student

## 2020-07-27 ENCOUNTER — Ambulatory Visit: Payer: BC Managed Care – PPO | Admitting: Student

## 2020-07-27 DIAGNOSIS — M179 Osteoarthritis of knee, unspecified: Secondary | ICD-10-CM | POA: Insufficient documentation

## 2020-07-27 DIAGNOSIS — M25562 Pain in left knee: Secondary | ICD-10-CM

## 2020-07-27 MED ORDER — MELOXICAM 7.5 MG PO TABS: 7.5000 mg | ORAL_TABLET | Freq: Every day | ORAL | 0 refills | Status: DC

## 2020-07-27 MED ORDER — ACETAMINOPHEN 500 MG PO TABS: 1000.0000 mg | ORAL_TABLET | Freq: Four times a day (QID) | ORAL | 0 refills | Status: AC | PRN

## 2020-07-27 NOTE — Assessment & Plan Note (Signed)
Patient presents for evaluation of one week history of dull pain in the posterior aspect of her left knee. She reports that the pain is constant, dull, nonradiating, worsened by activity and improved with rest and can be an 8/10 in severity. She reports associated symptoms of left knee instability, limp, and left knee swelling. She denies injury, redness, warmth, or hip pain. She denies right lower extremity pain or swelling.  On examination, patient walking with appropriate gait from waiting room to examination room. Left knee has no erythema, bruising or deformity. She has mild swelling of the popliteal fossa. She has full ROM of the extremity. Intact sensation. No tenderness to palpation of left knee or left hip. 4/5 strength with left hip flexion, 5/5 strength with hip extension, leg flexion and extension. Unable to perform McMurray test due to resistance to flexion of left hip.   Etiology of patient's posterior left knee pain is not clear at this time. Possible etiologies included popliteal cyst, osteoarthritis of left hip or left knee, left meniscal tear. Patient would benefit from trial of conservative measures including NSAIDs, acetaminophen, rest, ice and elevation of the extremity. Patient advised to take off work this week to rest leg. Plan for return to clinic in 4 weeks and sooner if needed.

## 2020-07-27 NOTE — Patient Instructions (Signed)
Ms. Hill,  It was a pleasure meeting you today in clinic.  For your left knee pain: I'm sorry that you are not feeling well. I strongly recommend that you take off work today, Thursday and Friday to give your knee some rest. In the meantime, perform RICE therapy like we discussed. Rest the knee, ice the knee and keep the knee elevated throughout the day. I ask that you take meloxicam upwards of twice a day and acetaminophen upwards of three times a day as needed for the pain. If you have any questions or concerns, please don't hesitate to reach out. We would like to see you back in a few weeks, especially if your knee pain persists or worsens.  Sincerely, Dr. Jasmine December, MD

## 2020-07-27 NOTE — Progress Notes (Signed)
   CC: Posterior left knee pain  HPI:  [name] is a 65 y.o. female with past medical history significant for HTN and HLD who presents to clinic for evaluation of left knee pain. Refer to problem list for charting of this encounter.  Past Medical History:  Diagnosis Date  . Hypertension    Review of Systems:  Endorses left knee pain, swelling, instability. Denies falls, redness, warmth, fevers, chills, hip pain.  Physical Exam:  Vitals:   07/27/20 0850  BP: (!) 154/84  Pulse: 86  Temp: 98.1 F (36.7 C)  TempSrc: Oral  SpO2: 98%  Weight: 182 lb 3.2 oz (82.6 kg)  Height: 5\' 7"  (1.702 m)   General: No acute distress, not ill appearing Skin: Left knee has no erythema, bruising or deformity.  MSK: patient walking with appropriate gait from waiting room to examination room. She has mild swelling of the popliteal fossa. She has full ROM of the extremity. Intact sensation. No tenderness to palpation of left knee or left hip. 4/5 strength with left hip flexion, 5/5 strength with hip extension, leg flexion and extension. Unable to perform McMurray test due to resistance to flexion of left hip.   Assessment & Plan:   See Encounters Tab for problem based charting.  Patient discussed with Dr. 

## 2020-07-28 ENCOUNTER — Observation Stay (HOSPITAL_COMMUNITY)
Admission: EM | Admit: 2020-07-28 | Discharge: 2020-07-29 | Disposition: A | Discharge status: Home / Self Care | Payer: BC Managed Care – PPO | Attending: Internal Medicine | Admitting: Internal Medicine

## 2020-07-28 ENCOUNTER — Encounter (HOSPITAL_COMMUNITY): Payer: Self-pay

## 2020-07-28 ENCOUNTER — Other Ambulatory Visit: Payer: Self-pay

## 2020-07-28 ENCOUNTER — Emergency Department (HOSPITAL_COMMUNITY): Payer: BC Managed Care – PPO

## 2020-07-28 DIAGNOSIS — K219 Gastro-esophageal reflux disease without esophagitis: Secondary | ICD-10-CM

## 2020-07-28 DIAGNOSIS — F17210 Nicotine dependence, cigarettes, uncomplicated: Secondary | ICD-10-CM | POA: Diagnosis not present

## 2020-07-28 DIAGNOSIS — R0789 Other chest pain: Secondary | ICD-10-CM | POA: Diagnosis not present

## 2020-07-28 DIAGNOSIS — R7303 Prediabetes: Secondary | ICD-10-CM

## 2020-07-28 DIAGNOSIS — I10 Essential (primary) hypertension: Secondary | ICD-10-CM | POA: Diagnosis not present

## 2020-07-28 DIAGNOSIS — E785 Hyperlipidemia, unspecified: Secondary | ICD-10-CM

## 2020-07-28 DIAGNOSIS — R0602 Shortness of breath: Secondary | ICD-10-CM | POA: Diagnosis not present

## 2020-07-28 DIAGNOSIS — Z79899 Other long term (current) drug therapy: Secondary | ICD-10-CM | POA: Insufficient documentation

## 2020-07-28 DIAGNOSIS — R079 Chest pain, unspecified: Secondary | ICD-10-CM | POA: Diagnosis not present

## 2020-07-28 DIAGNOSIS — Z20822 Contact with and (suspected) exposure to covid-19: Secondary | ICD-10-CM | POA: Diagnosis not present

## 2020-07-28 DIAGNOSIS — Z720 Tobacco use: Secondary | ICD-10-CM | POA: Diagnosis not present

## 2020-07-28 DIAGNOSIS — J9811 Atelectasis: Secondary | ICD-10-CM | POA: Diagnosis not present

## 2020-07-28 LAB — CBC WITH DIFFERENTIAL/PLATELET
Abs Immature Granulocytes: 0.02 10*3/uL (ref 0.00–0.07)
Basophils Absolute: 0.1 10*3/uL (ref 0.0–0.1)
Basophils Relative: 1 %
Eosinophils Absolute: 0.1 10*3/uL (ref 0.0–0.5)
Eosinophils Relative: 1 %
HCT: 36.4 % (ref 36.0–46.0)
Hemoglobin: 12.3 g/dL (ref 12.0–15.0)
Immature Granulocytes: 0 %
Lymphocytes Relative: 38 %
Lymphs Abs: 2.4 10*3/uL (ref 0.7–4.0)
MCH: 28.1 pg (ref 26.0–34.0)
MCHC: 33.8 g/dL (ref 30.0–36.0)
MCV: 83.1 fL (ref 80.0–100.0)
Monocytes Absolute: 0.4 10*3/uL (ref 0.1–1.0)
Monocytes Relative: 6 %
Neutro Abs: 3.4 10*3/uL (ref 1.7–7.7)
Neutrophils Relative %: 54 %
Platelets: 287 10*3/uL (ref 150–400)
RBC: 4.38 MIL/uL (ref 3.87–5.11)
RDW: 15.7 % — ABNORMAL HIGH (ref 11.5–15.5)
WBC: 6.3 10*3/uL (ref 4.0–10.5)
nRBC: 0 % (ref 0.0–0.2)

## 2020-07-28 LAB — RESP PANEL BY RT-PCR (FLU A&B, COVID) ARPGX2
Influenza A by PCR: NEGATIVE
Influenza B by PCR: NEGATIVE
SARS Coronavirus 2 by RT PCR: NEGATIVE

## 2020-07-28 LAB — LIPASE, BLOOD: Lipase: 26 U/L (ref 11–51)

## 2020-07-28 LAB — COMPREHENSIVE METABOLIC PANEL
ALT: 15 U/L (ref 0–44)
AST: 21 U/L (ref 15–41)
Albumin: 3.7 g/dL (ref 3.5–5.0)
Alkaline Phosphatase: 97 U/L (ref 38–126)
Anion gap: 11 (ref 5–15)
BUN: 7 mg/dL — ABNORMAL LOW (ref 8–23)
CO2: 25 mmol/L (ref 22–32)
Calcium: 9.9 mg/dL (ref 8.9–10.3)
Chloride: 106 mmol/L (ref 98–111)
Creatinine, Ser: 0.75 mg/dL (ref 0.44–1.00)
GFR, Estimated: >60 mL/min (ref >60)
Glucose, Bld: 117 mg/dL — ABNORMAL HIGH (ref 70–99)
Potassium: 3.2 mmol/L — ABNORMAL LOW (ref 3.5–5.1)
Sodium: 142 mmol/L (ref 135–145)
Total Bilirubin: 0.5 mg/dL (ref 0.3–1.2)
Total Protein: 7.4 g/dL (ref 6.5–8.1)

## 2020-07-28 LAB — D-DIMER, QUANTITATIVE: D-Dimer, Quant: 0.6 ug/mL-FEU — ABNORMAL HIGH (ref 0.00–0.50)

## 2020-07-28 LAB — TROPONIN I (HIGH SENSITIVITY)
Troponin I (High Sensitivity): 8 ng/L (ref <18)
Troponin I (High Sensitivity): 12 ng/L (ref <18)

## 2020-07-28 LAB — HIV ANTIBODY (ROUTINE TESTING W REFLEX): HIV Screen 4th Generation wRfx: NONREACTIVE

## 2020-07-28 MED ORDER — AMLODIPINE BESYLATE 10 MG PO TABS
10.0000 mg | ORAL_TABLET | Freq: Every day | ORAL | Status: DC
  Administered 2020-07-28 – 2020-07-29 (×2): 10 mg via ORAL
  Filled 2020-07-28: qty 1
  Filled 2020-07-28: qty 2

## 2020-07-28 MED ORDER — POTASSIUM CHLORIDE 20 MEQ PO PACK
40.0000 meq | PACK | Freq: Two times a day (BID) | ORAL | Status: AC
  Administered 2020-07-28 – 2020-07-29 (×2): 40 meq via ORAL
  Filled 2020-07-28 (×2): qty 2

## 2020-07-28 MED ORDER — FENTANYL CITRATE (PF) 100 MCG/2ML IJ SOLN
50.0000 ug | Freq: Once | INTRAMUSCULAR | Status: AC
  Administered 2020-07-28: 50 ug via INTRAVENOUS
  Filled 2020-07-28: qty 2

## 2020-07-28 MED ORDER — ALUM & MAG HYDROXIDE-SIMETH 200-200-20 MG/5ML PO SUSP
30.0000 mL | Freq: Once | ORAL | Status: AC
  Administered 2020-07-28: 30 mL via ORAL
  Filled 2020-07-28: qty 30

## 2020-07-28 MED ORDER — ATORVASTATIN CALCIUM 40 MG PO TABS
40.0000 mg | ORAL_TABLET | Freq: Every day | ORAL | Status: DC
  Administered 2020-07-28 – 2020-07-29 (×2): 40 mg via ORAL
  Filled 2020-07-28 (×2): qty 1

## 2020-07-28 MED ORDER — LIDOCAINE VISCOUS HCL 2 % MT SOLN
15.0000 mL | Freq: Once | OROMUCOSAL | Status: AC
  Administered 2020-07-28: 15 mL via ORAL
  Filled 2020-07-28: qty 15

## 2020-07-28 MED ORDER — NICOTINE 14 MG/24HR TD PT24
14.0000 mg | MEDICATED_PATCH | Freq: Every day | TRANSDERMAL | Status: DC
  Administered 2020-07-28 – 2020-07-29 (×2): 14 mg via TRANSDERMAL
  Filled 2020-07-28 (×2): qty 1

## 2020-07-28 MED ORDER — ENOXAPARIN SODIUM 40 MG/0.4ML ~~LOC~~ SOLN
40.0000 mg | SUBCUTANEOUS | Status: DC
  Administered 2020-07-28: 40 mg via SUBCUTANEOUS
  Filled 2020-07-28: qty 0.4

## 2020-07-28 MED ORDER — ADULT MULTIVITAMIN W/MINERALS CH
1.0000 | ORAL_TABLET | Freq: Every day | ORAL | Status: DC
  Administered 2020-07-28 – 2020-07-29 (×2): 1 via ORAL
  Filled 2020-07-28 (×2): qty 1

## 2020-07-28 MED ORDER — ACETAMINOPHEN 500 MG PO TABS: 1000.0000 mg | ORAL_TABLET | Freq: Four times a day (QID) | ORAL | Status: DC | PRN

## 2020-07-28 NOTE — ED Triage Notes (Signed)
Pt from home with ems for sudden onset left sided chest pain this morning. Pt given 324 ASA and 1 Nitro en route. BP initially 210/100, now 146/80 after nitro. Pt appears anxious.

## 2020-07-28 NOTE — ED Provider Notes (Signed)
[facility] EMERGENCY DEPARTMENT Provider Note   CSN: [id number] Arrival date & time: 07/28/20  1141     History Chief Complaint  Patient presents with  . Chest Pain    Laurie Contreras is a 65 y.o. female.  The history is provided by the patient.  Chest Pain Pain location:  L chest Pain quality: aching and sharp   Pain radiates to:  Does not radiate Pain severity:  Moderate Onset quality:  Gradual Duration:  3 hours Timing:  Constant Progression:  Unchanged Chronicity:  New Context: at rest   Relieved by:  Nothing Worsened by:  Deep breathing Ineffective treatments:  Nitroglycerin Associated symptoms: no abdominal pain, no back pain, no cough, no fever, no nausea, no palpitations, no shortness of breath and no vomiting   Risk factors: hypertension and smoking   Risk factors: no coronary artery disease, no diabetes mellitus and no prior DVT/PE        Past Medical History:  Diagnosis Date  . Hypertension     Patient Active Problem List   Diagnosis Date Noted  . Posterior left knee pain 07/27/2020  . Healthcare maintenance 10/07/2019  . Bereavement due to life event 09/05/2018  . Bacterial conjunctivitis of left eye 06/11/2018  . Right shoulder pain 05/20/2018  . Sessile colonic polyp 04/09/2017  . Left breast mass 03/13/2017  . Low back pain 02/22/2017  . Hyperlipidemia 02/22/2017  . Aortic atherosclerosis (HCC) 02/21/2017  . Tobacco abuse 09/08/2014  . Unsteady gait   . Lower extremity weakness 08/24/2014  . Tremor 08/24/2014  . Essential hypertension 08/24/2014    Past Surgical History:  Procedure Laterality Date  . TONSILLECTOMY       OB History    Gravida  2   Para  2   Term  2   Preterm      AB      Living        SAB      IAB      Ectopic      Multiple      Live Births              Family History  Problem Relation Age of Onset  . Hypertension Mother   . Stroke Mother   . Hypertension Father   .  Stroke Father   . Heart attack Sister   . Breast cancer Neg Hx     Social History   Tobacco Use  . Smoking status: Current Every Day Smoker    Packs/day: 0.50    Years: 40.00    Pack years: 20.00    Types: Cigarettes  . Smokeless tobacco: Never Used  . Tobacco comment: Cutting back  Substance Use Topics  . Alcohol use: Yes    Alcohol/week: 1.0 standard drink    Types: 1 Cans of beer per week    Comment: Occasioally.  . Drug use: No    Home Medications Prior to Admission medications   Medication Sig Start Date End Date Taking? Authorizing Provider  acetaminophen (TYLENOL) 500 MG tablet Take 2 tablets (1,000 mg total) by mouth every 6 (six) hours as needed. Patient taking differently: Take 1,000 mg by mouth every 6 (six) hours as needed (Leg pain). 07/27/20 08/26/20 Yes Roylene Reason, MD  amLODipine (NORVASC) 10 MG tablet TAKE 1 TABLET(10 MG) BY MOUTH DAILY Patient taking differently: Take 10 mg by mouth daily. 10/07/19 10/06/20 Yes Aslam, Leanna Sato, MD  atorvastatin (LIPITOR) 40 MG tablet Take 1 tablet (  40 mg total) by mouth daily. 06/30/20 06/30/21 Yes Gust Rung, DO  diphenhydrAMINE (BENADRYL) 25 mg capsule Take 2 capsules (50 mg total) by mouth every 6 (six) hours as needed for allergies. 10/07/19  Yes Aslam, Leanna Sato, MD  Multiple Vitamin (MULTIVITAMIN WITH MINERALS) TABS tablet Take 1 tablet by mouth daily.   Yes [provider]    Allergies    Naprosyn [naproxen]  Review of Systems   Review of Systems  Constitutional: Negative for chills and fever.  HENT: Negative for ear pain and sore throat.   Eyes: Negative for pain and visual disturbance.  Respiratory: Negative for cough and shortness of breath.   Cardiovascular: Positive for chest pain. Negative for palpitations.  Gastrointestinal: Negative for abdominal pain, nausea and vomiting.  Genitourinary: Negative for dysuria and hematuria.  Musculoskeletal: Negative for arthralgias and back pain.  Skin: Negative for  color change and rash.  Neurological: Negative for seizures and syncope.  All other systems reviewed and are negative.   Physical Exam Updated Vital Signs BP 121/86   Pulse 65   Temp 97.6 F (36.4 C) (Oral)   Resp 14   Ht 5\' 7"  (1.702 m)   Wt 82.6 kg   SpO2 93%   BMI 28.52 kg/m   Physical Exam Vitals and nursing note reviewed.  Constitutional:      General: She is in acute distress.     Appearance: She is well-developed and well-nourished. She is not ill-appearing.  HENT:     Head: Normocephalic and atraumatic.  Eyes:     Conjunctiva/sclera: Conjunctivae normal.     Pupils: Pupils are equal, round, and reactive to light.  Cardiovascular:     Rate and Rhythm: Normal rate and regular rhythm.     Pulses:          Radial pulses are 2+ on the right side and 2+ on the left side.       Dorsalis pedis pulses are 2+ on the right side and 2+ on the left side.     Heart sounds: Normal heart sounds. No murmur heard.   Pulmonary:     Effort: Pulmonary effort is normal. No respiratory distress.     Breath sounds: Normal breath sounds. No decreased breath sounds, wheezing or rhonchi.  Abdominal:     Palpations: Abdomen is soft. There is no mass.     Tenderness: There is no abdominal tenderness.  Musculoskeletal:        General: No edema. Normal range of motion.     Cervical back: Normal range of motion and neck supple.     Right lower leg: No edema.     Left lower leg: No edema.  Skin:    General: Skin is warm and dry.     Capillary Refill: Capillary refill takes less than 2 seconds.  Neurological:     General: No focal deficit present.     Mental Status: She is alert and oriented to person, place, and time.     Cranial Nerves: No cranial nerve deficit.     Motor: No weakness.  Psychiatric:        Mood and Affect: Mood and affect normal.     ED Results / Procedures / Treatments   Labs (all labs ordered are listed, but only abnormal results are displayed) Labs Reviewed   CBC WITH DIFFERENTIAL/PLATELET - Abnormal; Notable for the following components:      Result Value   RDW 15.7 (*)    All  other components within normal limits  COMPREHENSIVE METABOLIC PANEL - Abnormal; Notable for the following components:   Potassium 3.2 (*)    Glucose, Bld 117 (*)    BUN 7 (*)    All other components within normal limits  D-DIMER, QUANTITATIVE - Abnormal; Notable for the following components:   D-Dimer, Quant 0.60 (*)    All other components within normal limits  RESP PANEL BY RT-PCR (FLU A&B, COVID) ARPGX2  LIPASE, BLOOD  TROPONIN I (HIGH SENSITIVITY)  TROPONIN I (HIGH SENSITIVITY)    EKG EKG shows sinus rhythm.  Unchanged from prior EKG.  Normal intervals.  Radiology DG Chest Portable 1 View  Result Date: 07/28/2020 CLINICAL DATA:  Chest pain and short of breath EXAM: PORTABLE CHEST 1 VIEW COMPARISON:  03/18/2016 FINDINGS: Heart size upper normal. Vascularity normal. Negative for heart failure edema or effusion. Mild bibasilar atelectasis.  No effusion. IMPRESSION: Mild bibasilar atelectasis. Electronically Signed   By: Marlan Palau M.D.   On: 07/28/2020 12:57    Procedures Procedures   Medications Ordered in ED Medications  fentaNYL (SUBLIMAZE) injection 50 mcg (50 mcg Intravenous Given 07/28/20 1204)  alum & mag hydroxide-simeth (MAALOX/MYLANTA) 200-200-20 MG/5ML suspension 30 mL (30 mLs Oral Given 07/28/20 1418)    And  lidocaine (XYLOCAINE) 2 % viscous mouth solution 15 mL (15 mLs Oral Given 07/28/20 1418)    ED Course  I have reviewed the triage vital signs and the nursing notes.  Pertinent labs & imaging results that were available during my care of the patient were reviewed by me and considered in my medical decision making (see chart for details).    MDM Rules/Calculators/A&P                          LULAMAE SKORUPSKI is a 65 year old female with history of hypertension who presents to the ED with chest pain.  Normal vitals.  No fever.  EKG  shows sinus rhythm.  No ischemic changes when compared to prior EKGs.  EKG is unchanged from prior.  Patient had sharp left-sided chest pain that started this morning.  Patient denies any reflux type symptoms.  Got nitroglycerin and aspirin with EMS with not much relief.  Has hypertension and actually does have high cholesterol, heart score is 4.  Pain improved with fentanyl.  Troponins were 8 and 12.  Chest x-ray showed no signs of pneumothorax, pleural effusion, pneumonia.  D-dimer was age-adjusted normal.  Doubt PE.  History and physical not consistent with dissection.  Chest pain is much improved.  Did try GI cocktail but not much more improvement from that.  Lipase and gallbladder enzymes within normal limits.  Doubt pancreatitis or cholecystitis.  Overall given her risk factors and heart score of 4 believe observation admission for further ACS rule out is warranted.  Will admit to medicine.  This chart was dictated using voice recognition software.  Despite best efforts to proofread,  errors can occur which can change the documentation meaning.    Final Clinical Impression(s) / ED Diagnoses Final diagnoses:  Chest pain, unspecified type    Rx / DC Orders ED Discharge Orders    None       Virgina Norfolk, DO 07/28/20 1621

## 2020-07-28 NOTE — Discharge Summary (Signed)
Name: Laurie Contreras MRN: [id number] DOB: [birth year] 65 y.o. PCP: Gust Rung, DO  Date of Admission: 07/28/2020 11:41 AM Date of Discharge: 07/29/2020 Attending Physician: Dr. Reymundo Poll  Discharge Diagnosis: 1. Atypical chest pain 2. Hypertension  3. Hyperlipidemia 4. Tobacco use disorder 5. Prediabetes  Discharge Medications: Allergies as of 07/29/2020      Reactions   Naprosyn [naproxen] Itching      Medication List    TAKE these medications   acetaminophen 500 MG tablet Commonly known as: TYLENOL Take 2 tablets (1,000 mg total) by mouth every 6 (six) hours as needed. What changed: reasons to take this   amLODipine 10 MG tablet Commonly known as: NORVASC TAKE 1 TABLET(10 MG) BY MOUTH DAILY What changed:   how much to take  how to take this  when to take this  additional instructions   atorvastatin 40 MG tablet Commonly known as: Lipitor Take 1 tablet (40 mg total) by mouth daily.   diphenhydrAMINE 25 mg capsule Commonly known as: BENADRYL Take 2 capsules (50 mg total) by mouth every 6 (six) hours as needed for allergies.   multivitamin with minerals Tabs tablet Take 1 tablet by mouth daily.   pantoprazole 40 MG tablet Commonly known as: PROTONIX Take 1 tablet (40 mg total) by mouth daily. Start taking on: July 30, 2020      Disposition and follow-up:   Laurie Contreras was discharged from [facility] in Stable condition.  At the hospital follow up visit please address:  1. Atypical chest pain: HEART score 4. EKG w/o changes, trop negative. TTE reassuring. Likely MSK vs GERD. Started on pantoprazole. Please follow-up to ensure chest pain has resolved. Recommend outpatient stress test.  2. Hypertension: Normotensive throughout admission, continue on current medications.   3. Hyperlipidemia: LDL improved with statin therapy. Continue on current regimen.  4. Tobacco use disorder: Will need continued counseling  regarding smoking cessation.  5. Prediabetes: A1c 6.4 (previously 6.6), not on diabetic medications. Continue to monitor, can hold off further medications at this time.  *Of note, patient had colonoscopy in 2019 and it was recommended she undergo repeat colonoscopy in three years. We discussed with her that she needs to call GI for follow-up, so please ensure patient is aware she needs to have colonoscopy.  6.  Labs / imaging needed at time of follow-up: BMP  7.  Pending labs/ test needing follow-up: n/a  Follow-up Appointments:    Follow-up Information    Gust Rung, DO Follow up.   Specialty: Internal Medicine Contact information: [address] [place] [address] [phone]              Hospital Course by problem list: 1. Atypical chest pain: Patient presenting with acute-onset, left-sided chest pain. Pain is described as sharp, located behind her left breast, left side of chest, and left part of back. Reports worsening with movement, deep breaths. On arrival, afebrile and hemodynamically stable. ECG unchanged since previous study in 2017, troponin 8>12. Age-adjusted D-dimer normal, lipase normal. With HEART score 4, patient admitted for overnight observation. Started on pantoprazole and given GI cocktail as patient complaining of gas/burping. Will continue on pantoprazole upon discharge. TTE revealed EF 60-65% w/o RWMAs. Given cardiac etiology ruled out, patient stable for discharge and will [facility] next week.  2. Hypertension: Throughout admission patient remained normotensive on home medications. No signs of heart failure on exam, electrolytes stable. Will continue with home  medications.  3. Hyperlipidemia: Lipid panel re-checked, LDL 160>60 since starting statin. Will continue with statin therapy upon discharge.  4. Tobacco use disorder: Patient is current smoker. Will need further counseling on smoking cessation.  5. Prediabetes: A1c 6.4  during this admission. Previously 6.6 on last check. Patient not currently on diabetic medications, controlled with lifestyle modifications.   Discharge Exam:   BP (!) 153/69 (BP Location: Right Arm)   Pulse (!) 59   Temp 97.9 F (36.6 C) (Oral)   Resp 16   Ht 5\' 7"  (1.702 m)   Wt 82.6 kg   SpO2 94%   BMI 28.52 kg/m   Pertinent Labs, Studies, and Procedures:  CBC Latest Ref Rng & Units 07/28/2020 10/07/2019 03/18/2016  WBC 4.0 - 10.5 K/uL 6.3 6.3 6.4  Hemoglobin 12.0 - 15.0 g/dL 03/20/2016 62.2 29.7  Hematocrit 36.0 - 46.0 % 36.4 36.5 35.8(L)  Platelets 150 - 400 K/uL 287 283 264   BMP Latest Ref Rng & Units 07/28/2020 10/07/2019 02/21/2017  Glucose 70 - 99 mg/dL 02/23/2017) 211(H) 417(E)  BUN 8 - 23 mg/dL 7(L) 11 13  Creatinine 0.44 - 1.00 mg/dL 081(K 4.81 8.56  BUN/Creat Ratio 12 - 28 - 13 19  Sodium 135 - 145 mmol/L 142 140 141  Potassium 3.5 - 5.1 mmol/L 3.2(L) 3.5 3.2(L)  Chloride 98 - 111 mmol/L 106 104 100  CO2 22 - 32 mmol/L 25 21 27   Calcium 8.9 - 10.3 mg/dL 9.9 10.5(H) 10.7(H)   Lipid Panel     Component Value Date/Time   CHOL 128 07/29/2020 0435   CHOL 227 (H) 10/07/2019 1023   TRIG 186 (H) 07/29/2020 0435   HDL 28 (L) 07/29/2020 0435   HDL 42 10/07/2019 1023   CHOLHDL 4.6 07/29/2020 0435   VLDL 37 07/29/2020 0435   LDLCALC 63 07/29/2020 0435   LDLCALC 164 (H) 10/07/2019 1023   LABVLDL 21 10/07/2019 1023   A1c > 6.4  Troponin > 8>12  CXR 2/24 > Mild bibasilar atelectasis  TTE 2/25 > EF 60-65%, no RWMA's. Grade I diastolic dysfunction.   Discharge Instructions:    Discharge Instructions     [name], I am so glad you are feeling better! You were admitted so we could rule-out heart disease as the cause of your chest pain. Your blood work, EKG, and ultrasound (Echo) all look re-assuring. Likely your chest pain was due to acid reflux or musculoskeletal pain. You are now stable for discharge. Please see the following notes:  -We have started you on an anti-acid  medication, that could help relieve some of the reflux symptoms you are experiencing. The medication is called pantoprazole (Protonix), 40mg . You will take this once daily.   -Please make sure to follow-up with the clinic. They will give you a call to schedule your appointment.   It was a pleasure meeting you, Ms. Pebley. I wish you the best and hope you stay happy and healthy!  Thank you, 3/24, MD    Signed: 3/25, MD 07/28/2020, 6:35 PM   Pager: [phone]

## 2020-07-28 NOTE — H&P (Addendum)
Date: 07/28/2020               Patient Name:  Laurie Contreras MRN: [id number]  DOB: [birth year] Age / Sex: 65 y.o., female   PCP: Gust Rung, DO         Medical Service: Internal Medicine Teaching Service         Attending Physician: Dr. Reymundo Poll, MD    First Contact: Dr. Marijo Conception Pager: [phone]  Second Contact: Dr. Ephriam Knuckles Pager: [phone]       After Hours (After 5p/  First Contact Pager: [phone]  weekends / holidays): Second Contact Pager: [phone]   Chief Complaint: chest pain  History of Present Illness:  Ms. Frisby is 64yo person with hypertension presenting to [facility] with acute-onset chest pain. Patient reports she woke up this morning in her usual state of health. Around 11AM she reports onset of intermittently sharp and dull left-sided chest pain. Reports pain is "in between her breast and back." Mentions deep breaths worsens the pain. She denies dyspnea, but reports the pain makes it difficult to breath. She has had intermittent, short episodes of chest pain in the past that have been similar but spontaneously resolve. Denies fevers, chills, sweats, nausea, vomiting, abdominal pain, blurry vision, headaches, dysuria, constipation, diarrhea. She does note she had some left leg swelling and pain over the last week and was evaluated by [facility] yesterday, thought to be secondary to Baker's cyst vs osteoarthritis.   Meds:  Acetaminophen 500mg  Amlodipine 10mg  Atorvastatin 40mg  Benadryl 50mg  q6h PRN  Allergies: Allergies as of 07/28/2020 - Review Complete 07/28/2020  Allergen Reaction Noted  . Naprosyn [naproxen] Itching 02/21/2017   Past Medical History:  Diagnosis Date  . Hypertension    Family History:  Mother and father with hypertension, father with stroke, sister with MI. Recently had two siblings die of cancer.  Social History:  She lives alone in [place]. 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 Smokes 0.5-1ppd for  the past 50 years. Occasional alcohol use. Denies recreational drug use.  Review of Systems: A complete ROS was negative except as per HPI.   Physical Exam: Blood pressure 121/86, pulse 65, temperature 97.6 F (36.4 C), temperature source Oral, resp. rate 14, height 5\' 7"  (1.702 m), weight 82.6 kg, SpO2 93 %. General: Sitting up in bed, no acute distress HENT: Normocephalic, atraumatic.  CV: Regular rate, rhythm. No murmurs, rubs, gallops appreciated. Pulm: Clear to auscultation bilaterally. No wheezing, rhonchi, rales. GI: Soft, non-tender, non-distended. Normoactive bowel sounds. MSK: Normal bulk, tone. No pitting edema bilaterally. Pain reproducible on palpation on L side of chest. Skin: Warm, dry. No rashes or lesions appreciated. Neuro: Awake, alert, oriented x4. No focal deficits. Psych: Normal speech, mood, affect.  CBC Latest Ref Rng & Units 07/28/2020 10/07/2019 03/18/2016  WBC 4.0 - 10.5 K/uL 6.3 6.3 6.4  Hemoglobin 12.0 - 15.0 g/dL [employer] 07/30/2020  Hematocrit 36.0 - 46.0 % 36.4 36.5 35.8(L)  Platelets 150 - 400 K/uL 287 283 264   BMP Latest Ref Rng & Units 07/28/2020 10/07/2019 02/21/2017  Glucose 70 - 99 mg/dL 67.6) 19.5) 07/30/2020)  BUN 8 - 23 mg/dL 7(L) 11 13  Creatinine 0.44 - 1.00 mg/dL 12/07/2019 02/23/2017 093(O  BUN/Creat Ratio 12 - 28 - 13 19  Sodium 135 - 145 mmol/L 142 140 141  Potassium 3.5 - 5.1 mmol/L 3.2(L) 3.5 3.2(L)  Chloride 98 - 111 mmol/L 106 104 100  CO2 22 -  32 mmol/L 25 21 27   Calcium 8.9 - 10.3 mg/dL 9.9 10.5(H) 10.7(H)   Trop 8>12  EKG: personally reviewed my interpretation is normal sinus rhythm. Non-specific changes similar to previous study.  CXR: personally reviewed my interpretation is borderline cardiomegaly, bibasilar atelectasis, no pleural effusions or lobar consolidations  Assessment & Plan by Problem: Ms. Juliauna Stueve is 64yo person with hypertension and hyperlipidemia admitted 2/24 with left-sided chest pain for observation to rule-out ACS.    Active Problems:   Chest pain  #Atypical chest pain Patient's chest pain began this morning, non-anginal, no associated symptoms. Since arrival, patient has been afebrile and hemodynamically stable. On exam, cardiac and pulmonary exams were benign and chest pain was reproducible on patient's left side of chest. Troponin 8>12, ECG without changes from previous study. No contributing findings on chest x-ray. HEART score 4. Wells score 0, age-adjusted D-dimer normal. Lipase normal. 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 Plan to admit overnight and obtain Echo. Expect patient to be able to discharge tomorrow if TTE not concerning for ACS. Current risk factors: age, sex, hypertension, hyperlipidemia, tobacco use, family history. Will follow-up lipid panel and A1c. ASCVD risk 11.3%, will hold off initiating aspirin based on latest USPTF's recommendations. - F/u TTE - F/u A1c, lipid panel  #Hypokalemia K 3.1 on arrival, will replete orally and re-check in AM. - Daily BMP  #Tobacco use disorder - Nicotine patch  Chronic: #Hypertension Will continue on home amlodipine.  #Hyperlipidemia Re-checking lipid panel, will continue home atorvastatin  #Aortic atherosclerosis Initiating aspirin is not indicated for this patient.  Dispo: Admit patient to Observation with expected length of stay less than 2 midnights.  Signed: 3/24, MD 07/28/2020, 4:40 PM  Pager: [phone] After 5pm on weekdays and 1pm on weekends: On Call pager: [phone]

## 2020-07-29 ENCOUNTER — Observation Stay (HOSPITAL_BASED_OUTPATIENT_CLINIC_OR_DEPARTMENT_OTHER): Payer: BC Managed Care – PPO

## 2020-07-29 DIAGNOSIS — I361 Nonrheumatic tricuspid (valve) insufficiency: Secondary | ICD-10-CM

## 2020-07-29 DIAGNOSIS — R079 Chest pain, unspecified: Secondary | ICD-10-CM

## 2020-07-29 DIAGNOSIS — I10 Essential (primary) hypertension: Secondary | ICD-10-CM | POA: Diagnosis not present

## 2020-07-29 DIAGNOSIS — R0789 Other chest pain: Secondary | ICD-10-CM | POA: Diagnosis not present

## 2020-07-29 DIAGNOSIS — Z720 Tobacco use: Secondary | ICD-10-CM | POA: Diagnosis not present

## 2020-07-29 DIAGNOSIS — K219 Gastro-esophageal reflux disease without esophagitis: Secondary | ICD-10-CM

## 2020-07-29 LAB — LIPID PANEL
Cholesterol: 128 mg/dL (ref 0–200)
HDL: 28 mg/dL — ABNORMAL LOW (ref >40)
LDL Cholesterol: 63 mg/dL (ref 0–99)
Total CHOL/HDL Ratio: 4.6 RATIO
Triglycerides: 186 mg/dL — ABNORMAL HIGH (ref <150)
VLDL: 37 mg/dL (ref 0–40)

## 2020-07-29 LAB — CBC
HCT: 34.4 % — ABNORMAL LOW (ref 36.0–46.0)
Hemoglobin: 12.1 g/dL (ref 12.0–15.0)
MCH: 28.7 pg (ref 26.0–34.0)
MCHC: 35.2 g/dL (ref 30.0–36.0)
MCV: 81.7 fL (ref 80.0–100.0)
Platelets: 293 10*3/uL (ref 150–400)
RBC: 4.21 MIL/uL (ref 3.87–5.11)
RDW: 15.8 % — ABNORMAL HIGH (ref 11.5–15.5)
WBC: 5.8 10*3/uL (ref 4.0–10.5)
nRBC: 0 % (ref 0.0–0.2)

## 2020-07-29 LAB — BASIC METABOLIC PANEL
Anion gap: 9 (ref 5–15)
BUN: 11 mg/dL (ref 8–23)
CO2: 25 mmol/L (ref 22–32)
Calcium: 10 mg/dL (ref 8.9–10.3)
Chloride: 105 mmol/L (ref 98–111)
Creatinine, Ser: 0.79 mg/dL (ref 0.44–1.00)
GFR, Estimated: >60 mL/min (ref >60)
Glucose, Bld: 110 mg/dL — ABNORMAL HIGH (ref 70–99)
Potassium: 3.9 mmol/L (ref 3.5–5.1)
Sodium: 139 mmol/L (ref 135–145)

## 2020-07-29 LAB — HEMOGLOBIN A1C
Hgb A1c MFr Bld: 6.4 % — ABNORMAL HIGH (ref 4.8–5.6)
Mean Plasma Glucose: 136.98 mg/dL

## 2020-07-29 LAB — ECHOCARDIOGRAM COMPLETE
Height: 67 in
S' Lateral: 3.6 cm
Weight: 2913.6 oz

## 2020-07-29 MED ORDER — ALUM & MAG HYDROXIDE-SIMETH 200-200-20 MG/5ML PO SUSP
30.0000 mL | ORAL | Status: DC | PRN
  Administered 2020-07-29: 30 mL via ORAL
  Filled 2020-07-29: qty 30

## 2020-07-29 MED ORDER — FAMOTIDINE 20 MG PO TABS: 20.0000 mg | ORAL_TABLET | Freq: Every day | ORAL | Status: DC

## 2020-07-29 MED ORDER — PANTOPRAZOLE SODIUM 40 MG PO TBEC: 40.0000 mg | DELAYED_RELEASE_TABLET | Freq: Every day | ORAL | 0 refills | Status: DC

## 2020-07-29 MED ORDER — PANTOPRAZOLE SODIUM 40 MG PO TBEC
40.0000 mg | DELAYED_RELEASE_TABLET | Freq: Every day | ORAL | Status: DC
  Administered 2020-07-29: 40 mg via ORAL
  Filled 2020-07-29: qty 1

## 2020-07-29 NOTE — Progress Notes (Signed)
   Subjective:  Patient evaluated at bedside this AM. Reports chest pain that started yesterday, worse with movement. Does note some improvement of pain overnight. Also mentions having gas and needing to burp. She also mentions she had fried foods the night before arrival. Denies fevers, chills, dyspnea.  Objective:  Vital signs in last 24 hours: Vitals:   07/29/20 0419 07/29/20 0757 07/29/20 0903 07/29/20 1210  BP: (!) 141/79 129/72 135/72 (!) 148/79  Pulse: 76 72  75  Resp: 16 16  16   Temp: 97.8 F (36.6 C) 97.8 F (36.6 C)  97.6 F (36.4 C)  TempSrc: Oral Oral  Oral  SpO2: 96% 94%  92%  Weight:      Height:       Physical Exam: General: Sitting up in bed, no acute distress CV: Regular rate, rhythm. No m/r/g appreciated Pulm: Clear to auscultation bilaterally. No wheezing, rhonchi, rales MSK: Normal bulk, tone. No pitting edema bilaterally.   CBC Latest Ref Rng & Units 07/29/2020 07/28/2020 10/07/2019  WBC 4.0 - 10.5 K/uL 5.8 6.3 6.3  Hemoglobin 12.0 - 15.0 g/dL 12/07/2019 87.8 67.6  Hematocrit 36.0 - 46.0 % 34.4(L) 36.4 36.5  Platelets 150 - 400 K/uL 293 287 283   BMP Latest Ref Rng & Units 07/29/2020 07/28/2020 10/07/2019  Glucose 70 - 99 mg/dL 12/07/2019) 947(S) 962(E)  BUN 8 - 23 mg/dL 11 7(L) 11  Creatinine 0.44 - 1.00 mg/dL 366(Q 9.47 6.54  BUN/Creat Ratio 12 - 28 - - 13  Sodium 135 - 145 mmol/L 139 142 140  Potassium 3.5 - 5.1 mmol/L 3.9 3.2(L) 3.5  Chloride 98 - 111 mmol/L 105 106 104  CO2 22 - 32 mmol/L 25 25 21   Calcium 8.9 - 10.3 mg/dL 6.50 9.9 10.5(H)   Assessment/Plan: Laurie Contreras is 64yo person with hypertensin and hyperlipidemia admitted 2/24 with atypical chest pain, concerning for possibly GERD-related vs MSK.  Active Problems:   Chest pain  #Atypical chest pain This AM, patient reports chest pain improved from yesterday. Mentions some relief with burping as well. Possible component of GERD, although patient's history also consistent with MSK pain. She did  mention eating a large fried meal the night prior to presentation. Will give GI cocktail and start PPI. TTE pending, will follow-up. - F/u TTE - GI cocktail - Protonix 40mg  qd  #Pre-diabetes A1c 6.4, down from 6.6 previously. Not on any medications at home, controlled with lifestyle modifications. Will follow-up with PCP - F/u PCP  #Tobacco use disorder - Nicotine patch  DIET: Regular IVF: n/a DVT PPX: Lovenox BOWEL: n/a CODE: FULL FAM COM: n/a  Prior to Admission Living Arrangement: Home Anticipated Discharge Location: Home Barriers to Discharge: medical management Dispo: Anticipated discharge in approximately 0-1 day(s).   Becky Sax, MD 07/29/2020, 1:58 PM Pager: [phone] After 5pm on weekdays and 1pm on weekends: On Call pager [phone]

## 2020-07-29 NOTE — Discharge Instructions (Signed)
[name], I am so glad you are feeling better! You were admitted so we could rule-out heart disease as the cause of your chest pain. Your blood work, EKG, and ultrasound (Echo) all look re-assuring. Likely your chest pain was due to acid reflux or musculoskeletal pain. You are now stable for discharge. Please see the following notes:  -We have started you on an anti-acid medication, that could help relieve some of the reflux symptoms you are experiencing. The medication is called pantoprazole (Protonix), 40mg . You will take this once daily.   -Please make sure to follow-up with the clinic. They will give you a call to schedule your appointment.   It was a pleasure meeting you, [name]. I wish you the best and hope you stay happy and healthy!  Thank you, , MD

## 2020-07-29 NOTE — Progress Notes (Incomplete)
Nsg Discharge Note  Admit Date:  07/28/2020 Discharge date: 07/29/2020   SIERRIA BRUNEY to be D/C'd {Discharge Destination:18313::"Home"} per MD order.  AVS completed.  Copy for chart, and copy for patient signed, and dated. Patient/caregiver able to verbalize understanding.  Discharge Medication: Allergies as of 07/29/2020      Reactions   Naprosyn [naproxen] Itching      Medication List    TAKE these medications   acetaminophen 500 MG tablet Commonly known as: TYLENOL Take 2 tablets (1,000 mg total) by mouth every 6 (six) hours as needed. What changed: reasons to take this   amLODipine 10 MG tablet Commonly known as: NORVASC TAKE 1 TABLET(10 MG) BY MOUTH DAILY What changed:   how much to take  how to take this  when to take this  additional instructions   atorvastatin 40 MG tablet Commonly known as: Lipitor Take 1 tablet (40 mg total) by mouth daily.   diphenhydrAMINE 25 mg capsule Commonly known as: BENADRYL Take 2 capsules (50 mg total) by mouth every 6 (six) hours as needed for allergies.   multivitamin with minerals Tabs tablet Take 1 tablet by mouth daily.   pantoprazole 40 MG tablet Commonly known as: PROTONIX Take 1 tablet (40 mg total) by mouth daily. Start taking on: July 30, 2020       Discharge Assessment: Vitals:   07/29/20 0903 07/29/20 1210  BP: 135/72 (!) 148/79  Pulse:  75  Resp:  16  Temp:  97.6 F (36.4 C)  SpO2:  92%   Skin clean, dry and intact without evidence of skin break down, no evidence of skin tears noted. IV catheter discontinued intact. Site without signs and symptoms of complications - no redness or edema noted at insertion site, patient denies c/o pain - only slight tenderness at site.  Dressing with slight pressure applied.  D/c Instructions-Education: Discharge instructions given to patient/family with verbalized understanding. D/c education completed with patient/family including follow up instructions,  medication list, d/c activities limitations if indicated, with other d/c instructions as indicated by MD - patient able to verbalize understanding, all questions fully answered. Patient instructed to return to ED, call 911, or call MD for any changes in condition.  Patient escorted via WC, and D/C home via private auto.  Boykin Nearing, RN 07/29/2020 3:04 PM

## 2020-07-29 NOTE — Progress Notes (Signed)
  Echocardiogram 2D Echocardiogram has been performed.  Delcie Roch 07/29/2020, 11:33 AM

## 2020-07-31 NOTE — Progress Notes (Signed)
Internal Medicine Clinic Attending  Case discussed with Dr. Johnson at the time of the visit.  We reviewed the resident's history and exam and pertinent patient test results.  I agree with the assessment, diagnosis, and plan of care documented in the resident's note.  

## 2020-08-01 ENCOUNTER — Other Ambulatory Visit: Payer: Self-pay | Admitting: *Deleted

## 2020-08-01 NOTE — Telephone Encounter (Signed)
Pt is requesting a 90 day supply. Thanks 

## 2020-08-02 MED ORDER — OMEPRAZOLE 40 MG PO CPDR: 40.0000 mg | DELAYED_RELEASE_CAPSULE | Freq: Every day | ORAL | 0 refills | Status: DC

## 2020-08-02 NOTE — Telephone Encounter (Signed)
Refill Request  Pt unable to afford the current medication listed below as it is $88.00 for only 30 pills.  Pt is requesting a different medication that she can afford that her ins will cover for the  for 90 days.   pantoprazole (PROTONIX) 40 MG tablet  [id number] - [place], [place] - [address] AT [address]

## 2020-08-02 NOTE — Telephone Encounter (Signed)
Sent Rx for omeprazole 40mg  daily.

## 2020-08-08 ENCOUNTER — Ambulatory Visit (INDEPENDENT_AMBULATORY_CARE_PROVIDER_SITE_OTHER): Payer: BC Managed Care – PPO | Admitting: Student

## 2020-08-08 ENCOUNTER — Other Ambulatory Visit: Payer: Self-pay | Admitting: Student

## 2020-08-08 ENCOUNTER — Other Ambulatory Visit: Payer: Self-pay

## 2020-08-08 ENCOUNTER — Encounter: Payer: Self-pay | Admitting: Student

## 2020-08-08 VITALS — BP 147/72 | HR 76 | Temp 98.0°F | Ht 67.0 in | Wt 184.8 lb

## 2020-08-08 DIAGNOSIS — Z23 Encounter for immunization: Secondary | ICD-10-CM

## 2020-08-08 DIAGNOSIS — G4700 Insomnia, unspecified: Secondary | ICD-10-CM

## 2020-08-08 DIAGNOSIS — M25562 Pain in left knee: Secondary | ICD-10-CM

## 2020-08-08 DIAGNOSIS — Z0000 Encounter for general adult medical examination without abnormal findings: Secondary | ICD-10-CM | POA: Diagnosis not present

## 2020-08-08 DIAGNOSIS — I10 Essential (primary) hypertension: Secondary | ICD-10-CM

## 2020-08-08 DIAGNOSIS — R0789 Other chest pain: Secondary | ICD-10-CM | POA: Diagnosis not present

## 2020-08-08 MED ORDER — LOSARTAN POTASSIUM 25 MG PO TABS: 25.0000 mg | ORAL_TABLET | Freq: Every day | ORAL | 0 refills | Status: DC

## 2020-08-08 NOTE — Assessment & Plan Note (Signed)
-  Last colonoscopy 2018 w/ recommended 3y follow-up. Referral placed today for colonoscopy.   -Patient reports COVID-19 vaccine x3.  -Patient received Tdap and PCV13 today.  -Mammogram due 2023, can also discuss DEXA then.

## 2020-08-08 NOTE — Assessment & Plan Note (Signed)
Patient reports her left knee pain/swelling recently returned. She was evaluated in clinic roughly two weeks ago with similar complaint. She reports yesterday she was walking up "many" stairs at church and later started to feel dull, achy left knee pain. States she took Tylenol and rested, iced, elevated later last night. Mentions this morning the pain is much improved and the swelling is down. Overall reports pain controlled with over the counter analgesics.  A/P: On exam, no signs of acute inflammation or infection. Structural integrity appears in tact. Most likely degree of osteoarthritis. Counseled patient on heavy use of NSAID's, including kidney injury and GI bleeds. Recommended Tylenol 650mg  four times per day for the pain as well as icing, resting, and elevating. - Tylenol 650mg  q6h PRN - RICE as needed

## 2020-08-08 NOTE — Assessment & Plan Note (Signed)
BP Readings from Last 3 Encounters:  08/08/20 (!) 147/72  07/29/20 (!) 148/79  07/27/20 (!) 154/84   BP elevated w/ systolic 140's. Upon chart review, previously on chlorthalidone. Patient reports she is unsure of what happened, but is no longer taking that. Otherwise denies chest pain, dyspnea, nausea, vomiting.  A/P: Given patient is pre-diabetic, will add ARB. Can consider switching back to chlorthalidone if not tolerated well.  - Start losartan 25mg  daily - BMET today

## 2020-08-08 NOTE — Assessment & Plan Note (Signed)
Patient mentions she has had trouble going to sleep recently. Describes her nighttime routine as watching television in bed for a few hours prior to trying to go to sleep. States she sleeps fine once she finally falls asleep.  A/P: Discussed with patient good sleep hygiene, including avoiding television, phone, or computer time in the hour or so prior to bed. Also encouraged patient to avoid going to bed prior to when it's time for her to sleep. Also counseled patient on avoiding alcohol and caffeine late in the day. Will try different sleep habits and will re-assess at next visit.

## 2020-08-08 NOTE — Assessment & Plan Note (Signed)
Patient recently admitted with chest pain, most likely due to GERD vs MSK. Since discharge, she states she has not been able to get PPI due to cost. However, patient's symptoms have been well-controlled using over the counter antacid (she is unsure what it is called). Reports no further episodes of chest pain, denies dyspnea, fevers, chills, recent illness.  A/P: Due to co-morbidities and latest chest pain, will put in referral for outpatient stress test. Most likely chest pain related to GERD, currently well-controlled with over the counter medications. Will hold off further PPI, can consider further options if symptoms re-occur. - Referral for stress test - C/w home antacid

## 2020-08-08 NOTE — Patient Instructions (Signed)
Laurie Contreras,  It was a pleasure seeing you today!  Today we discussed the chest pain you were recently having in the hospital. Most likely this was musculoskeletal pain and pain related to acid reflux. I am glad the over the counter medication is working well. Please make sure to bring that medication with you at your next appointment.  We have started you on a new medication today call losartan (Cozaar) 25mg . Please take this once daily. We would like to see you back in clinic in 4 weeks to re-check your blood pressure.  We also discussed your left knee pain. This is most likely due to arthritis. Continue to use Tylenol (take up to 650mg  every 6 hours), ice, and resting.  We have sent in a referral for a stress test with the cardiologists. We have also sent a referral in for you to get your routine colonoscopy.  Today we also administered your tetanus and Pneumococcal vaccines.  We look forward to seeing you next time. Please call our clinic at [phone] if you have any questions or concerns. The best time to call is Monday-Friday from 9am-4pm, but there is someone available 24/7 at the same number. If you need medication refills, please notify your pharmacy one week in advance and they will send a request.  Thank you for letting [phone] take part in your care. Wishing you the best!  Thank you, Dr. [place], MD

## 2020-08-08 NOTE — Progress Notes (Signed)
   CC: left knee pain, hospital follow-up  HPI:  [name] is a 65 y.o. with hypertension presenting to [facility] for hospital follow-up and left knee pain.  Please see problem-based list for further details, assessments, and plans.  Past Medical History:  Diagnosis Date  . Hypertension    Review of Systems:  As per HPI  Physical Exam:  Vitals:   08/08/20 0905 08/08/20 0940  BP: (!) 145/73 (!) 147/72  Pulse: 83 76  Temp: 98 F (36.7 C)   TempSrc: Oral   SpO2: 96%   Weight: 184 lb 12.8 oz (83.8 kg)   Height: 5\' 7"  (1.702 m)    General: Sitting comfortably in chair, no acute distress CV: Regular rate, rhythm. No m/r/g appreciated Pulm: Normal work of breathing. Clear to auscultation bilaterally MSK: Normal bulk, tone. No pitting edema bilaterally. L knee without warmth or erythema. No effusion appreciated. Laxity tests normal. Anterior, posterior tests negative.  Assessment & Plan:   See Encounters Tab for problem based charting.  Patient discussed with Dr. 

## 2020-08-09 LAB — BMP8+ANION GAP
Anion Gap: 16 mmol/L (ref 10.0–18.0)
BUN/Creatinine Ratio: 12 (ref 12–28)
BUN: 10 mg/dL (ref 8–27)
CO2: 22 mmol/L (ref 20–29)
Calcium: 10.6 mg/dL — ABNORMAL HIGH (ref 8.7–10.3)
Chloride: 104 mmol/L (ref 96–106)
Creatinine, Ser: 0.81 mg/dL (ref 0.57–1.00)
Glucose: 110 mg/dL — ABNORMAL HIGH (ref 65–99)
Potassium: 3.5 mmol/L (ref 3.5–5.2)
Sodium: 142 mmol/L (ref 134–144)
eGFR: 81 mL/min/{1.73_m2} (ref >59)

## 2020-08-10 ENCOUNTER — Telehealth: Payer: Self-pay | Admitting: Internal Medicine

## 2020-08-10 NOTE — Progress Notes (Signed)
Internal Medicine Clinic Attending ? ?Case discussed with Dr. Braswell  At the time of the visit.  We reviewed the resident?s history and exam and pertinent patient test results.  I agree with the assessment, diagnosis, and plan of care documented in the resident?s note.  ?

## 2020-08-10 NOTE — Telephone Encounter (Signed)
   Telephone encounter was:  Successful.  08/10/2020 Name: [name] MRN: [id number] DOB: 10/15/1955  Laurie Contreras is a 65 y.o. year old female who is a primary care patient of Gust Rung, DO . The community resource team was consulted for assistance with Housing Assistance  Care guide performed the following interventions: Spoke with Ms. Hove today regarding referral. Patient stated that she no longer needs assistance. She stated she spoke with her landlord and was told the property will be sold in the distance future. Ms. Brandy stated that she will give the office a call if she needs assistance in the future..  Follow Up Plan:  No further follow up planned at this time. The patient has been provided with needed resources.  [facility] Care Guide, Embedded Care Coordination [facility], Care Management Phone: [phone] Email: [email or web address]

## 2020-08-11 ENCOUNTER — Other Ambulatory Visit: Payer: Self-pay | Admitting: Student

## 2020-08-16 ENCOUNTER — Telehealth: Payer: Self-pay | Admitting: Cardiology

## 2020-08-16 NOTE — Telephone Encounter (Signed)
Add on for tomorrow 03/16 at 9:40am with Dr. Cristal Deer

## 2020-08-17 ENCOUNTER — Ambulatory Visit: Payer: BC Managed Care – PPO | Admitting: Cardiology

## 2020-08-17 NOTE — Progress Notes (Incomplete)
Cardiology Office Note:    Date:  08/17/2020   ID:  Laurie Contreras, DOB 04-27-1956, MRN [id number]  PCP:  Gust Rung, DO  Cardiologist:  No primary care provider on file.  Referring MD: Earl Lagos, MD   No chief complaint on file.   History of Present Illness:    Laurie Contreras is a 65 y.o. female with a hx of hypertension, aortic atherosclerosis, hyperlipidemia, tobacco use, prediabetes vs type II diabetes who is seen as a new consult at the request of Earl Lagos, MD for the evaluation and management of chest pain.  Recent hospitalization (discharge 07/29/19) reviewed. Admitted for atypical chest pain. hsTnI unremarkable, ECG without acute changes, echo normal. Felt to be GERD, but recommended for outpatient stress test given risk factors.  Chest pain: -Initial onset: -Quality: -Frequency: -Duration: -Associated symptoms: -Aggravating/alleviating factors: -Prior cardiac history: -Prior ECG:  -Prior workup: -Prior treatment: -Alcohol: -Tobacco: -Comorbidities:  -Exercise level: -Cardiac ROS: no shortness of breath, no PND, no orthopnea, no LE edema, no syncope -Family history:   Past Medical History:  Diagnosis Date  . Hypertension     Past Surgical History:  Procedure Laterality Date  . TONSILLECTOMY      Current Medications: Current Outpatient Medications on File Prior to Visit  Medication Sig  . acetaminophen (TYLENOL) 500 MG tablet Take 2 tablets (1,000 mg total) by mouth every 6 (six) hours as needed. (Patient taking differently: Take 1,000 mg by mouth every 6 (six) hours as needed (Leg pain).)  . amLODipine (NORVASC) 10 MG tablet TAKE 1 TABLET(10 MG) BY MOUTH DAILY (Patient taking differently: Take 10 mg by mouth daily.)  . atorvastatin (LIPITOR) 40 MG tablet Take 1 tablet (40 mg total) by mouth daily.  . diphenhydrAMINE (BENADRYL) 25 mg capsule Take 2 capsules (50 mg total) by mouth every 6 (six) hours as needed for allergies.  Marland Kitchen  losartan (COZAAR) 25 MG tablet TAKE 1 TABLET(25 MG) BY MOUTH DAILY  . Multiple Vitamin (MULTIVITAMIN WITH MINERALS) TABS tablet Take 1 tablet by mouth daily.  Marland Kitchen omeprazole (PRILOSEC) 40 MG capsule Take 1 capsule (40 mg total) by mouth daily.   No current facility-administered medications on file prior to visit.     Allergies:   Naprosyn [naproxen]   Social History   Tobacco Use  . Smoking status: Current Every Day Smoker    Packs/day: 0.50    Years: 40.00    Pack years: 20.00    Types: Cigarettes  . Smokeless tobacco: Never Used  . Tobacco comment: Cutting back  Substance Use Topics  . Alcohol use: Yes    Alcohol/week: 1.0 standard drink    Types: 1 Cans of beer per week    Comment: Occasioally.  . Drug use: No    Family History: family history includes Heart attack in her sister; Hypertension in her father and mother; Stroke in her father and mother. There is no history of Breast cancer.  ROS:   Please see the history of present illness.  Additional pertinent ROS: Constitutional: Negative for chills, fever, night sweats, unintentional weight loss  HENT: Negative for ear pain and hearing loss.   Eyes: Negative for loss of vision and eye pain.  Respiratory: Negative for cough, sputum, wheezing.   Cardiovascular: See HPI. Gastrointestinal: Negative for abdominal pain, melena, and hematochezia.  Genitourinary: Negative for dysuria and hematuria.  Musculoskeletal: Negative for falls and myalgias.  Skin: Negative for itching and rash.  Neurological: Negative for focal weakness, focal sensory changes  and loss of consciousness.  Endo/Heme/Allergies: Does not bruise/bleed easily.     EKGs/Labs/Other Studies Reviewed:    The following studies were reviewed today: ***  EKG:  EKG is personally reviewed.  The ekg ordered today demonstrates ***  Recent Labs: 07/28/2020: ALT 15 07/29/2020: Hemoglobin 12.1; Platelets 293 08/08/2020: BUN 10; Creatinine, Ser 0.81; Potassium 3.5;  Sodium 142  Recent Lipid Panel    Component Value Date/Time   CHOL 128 07/29/2020 0435   CHOL 227 (H) 10/07/2019 1023   TRIG 186 (H) 07/29/2020 0435   HDL 28 (L) 07/29/2020 0435   HDL 42 10/07/2019 1023   CHOLHDL 4.6 07/29/2020 0435   VLDL 37 07/29/2020 0435   LDLCALC 63 07/29/2020 0435   LDLCALC 164 (H) 10/07/2019 1023    Physical Exam:    VS:  There were no vitals taken for this visit.    Wt Readings from Last 3 Encounters:  08/08/20 184 lb 12.8 oz (83.8 kg)  07/28/20 182 lb 1.6 oz (82.6 kg)  07/27/20 182 lb 3.2 oz (82.6 kg)    GEN: Well nourished, well developed in no acute distress HEENT: Normal, moist mucous membranes NECK: No JVD CARDIAC: regular rhythm, normal S1 and S2, no rubs or gallops. No murmur. VASCULAR: Radial and DP pulses 2+ bilaterally. No carotid bruits RESPIRATORY:  Clear to auscultation without rales, wheezing or rhonchi  ABDOMEN: Soft, non-tender, non-distended MUSCULOSKELETAL:  Ambulates independently SKIN: Warm and dry, no edema NEUROLOGIC:  Alert and oriented x 3. No focal neuro deficits noted. PSYCHIATRIC:  Normal affect    ASSESSMENT:    No diagnosis found. PLAN:     Cardiac risk counseling and prevention recommendations: -recommend heart healthy/Mediterranean diet, with whole grains, fruits, vegetable, fish, lean meats, nuts, and olive oil. Limit salt. -recommend moderate walking, 3-5 times/week for 30-50 minutes each session. Aim for at least 150 minutes.week. Goal should be pace of 3 miles/hours, or walking 1.5 miles in 30 minutes -recommend avoidance of tobacco products. Avoid excess alcohol. -Additional risk factor control:  -Diabetes risk: A1c is  -Lipids:   -Blood pressure control:  -Weight:  -ASCVD risk score: The ASCVD Risk score Denman George DC Jr., et al., 2013) failed to calculate for the following reasons:   The valid total cholesterol range is 130 to 320 mg/dL    Plan for follow up:  Jodelle Red, MD, PhD, [facility]  [facility]    Medication Adjustments/Labs and Tests Ordered: Current medicines are reviewed at length with the patient today.  Concerns regarding medicines are outlined above.  No orders of the defined types were placed in this encounter.  No orders of the defined types were placed in this encounter.   There are no Patient Instructions on file for this visit.  Signed, Jodelle Red, MD PhD 08/17/2020 8:15 AM    [facility]

## 2020-08-22 ENCOUNTER — Encounter: Payer: Self-pay | Admitting: Student

## 2020-08-22 ENCOUNTER — Other Ambulatory Visit: Payer: Self-pay

## 2020-08-22 ENCOUNTER — Ambulatory Visit: Payer: BC Managed Care – PPO | Admitting: Student

## 2020-08-22 VITALS — BP 123/65 | HR 86 | Temp 98.2°F | Ht 67.0 in | Wt 184.8 lb

## 2020-08-22 DIAGNOSIS — M25562 Pain in left knee: Secondary | ICD-10-CM

## 2020-08-22 NOTE — Assessment & Plan Note (Addendum)
Patient reports left knee pain that started 1 month ago.  She reports associated swelling and pain that worse with standing for a long time and walking upstairs.  She works at the [employer] which she has to stand 8 hours a day, 6 days a week.  Denies fever or chills.  Denies trauma to the knee or any past surgery.  She was seen at the clinic 2 weeks ago for similar symptom and thought was due to osteoarthritis.  She was counseled on RICE therapy and Tylenol as needed.  Assessment and plan This is likely osteoarthritis.  Low suspicion for infective arthritis because of the timing and no systemic symptoms.  No Baker's cyst noted on exam.  Physical exam is negative for any joint laxity.   Given her life activity limitation, will obtain x-ray of left knee to evaluate for the degree of her osteoarthritis.  Advice patient to continue RICE therapy and Tylenol as needed.  Also advised patient to try Voltaren gel for symptom relief.  We will write a work notes for light duty for 1 week.  -Pending left knee x-ray.  If her symptoms are not controlled with conservative therapy, will refer to orthopedics for possible surgery. -RICE therapy, Tylenol and Voltaren gel -Work note for light duty job for 1 week

## 2020-08-22 NOTE — Progress Notes (Signed)
   CC: Left knee pain  HPI:  [name] is a 65 y.o. with past medical history of hypertension, hyperlipidemia who presented to clinic today for left knee pain.  Please see problem based charting for further detail  Past Medical History:  Diagnosis Date  . Hypertension    Review of Systems:  Review of Systems  Constitutional: Negative for chills and fever.  Musculoskeletal: Positive for joint pain and myalgias.    Physical Exam:  Vitals:   08/22/20 1402  BP: 123/65  Pulse: 86  Temp: 98.2 F (36.8 C)  TempSrc: Oral  SpO2: 93%  Weight: 184 lb 12.8 oz (83.8 kg)  Height: 5\' 7"  (1.702 m)   Physical Exam Constitutional:      General: She is not in acute distress. Cardiovascular:     Rate and Rhythm: Normal rate and regular rhythm.  Pulmonary:     Effort: Pulmonary effort is normal. No respiratory distress.  Musculoskeletal:     Comments: No joint effusion appreciated of left knee.  Left knee appears similar to right knee.  No knee varus or valgus. Good stability of left knee, no joint laxity. No Baker's cyst noted Limited range of motion of left knee. No change of the overlying skin  Skin:    General: Skin is warm.  Neurological:     Mental Status: She is alert.  Psychiatric:        Mood and Affect: Mood normal.     Assessment & Plan:   See Encounters Tab for problem based charting.  Patient discussed with Dr. 

## 2020-08-22 NOTE — Patient Instructions (Signed)
[name],  It is a pleasure seeing you in the clinic today.  I am sorry that your knee is hurting.  This is likely osteoarthritis.  We will obtain an x-ray of your knee to evaluate for the severity of your arthritis.  Continues RICE therapy.  You can take Tylenol for pain as needed.  You can also obtain Voltaren gel over-the-counter.  I will call you for the results of the x-ray  Take care  Dr. Cyndie Chime

## 2020-08-23 ENCOUNTER — Telehealth: Payer: Self-pay

## 2020-08-23 ENCOUNTER — Ambulatory Visit (HOSPITAL_COMMUNITY)
Admission: RE | Admit: 2020-08-23 | Discharge: 2020-08-23 | Disposition: A | Discharge status: Home / Self Care | Payer: BC Managed Care – PPO | Source: Ambulatory Visit | Attending: Student in an Organized Health Care Education/Training Program | Admitting: Student in an Organized Health Care Education/Training Program

## 2020-08-23 DIAGNOSIS — M1712 Unilateral primary osteoarthritis, left knee: Secondary | ICD-10-CM | POA: Diagnosis not present

## 2020-08-23 DIAGNOSIS — M25562 Pain in left knee: Secondary | ICD-10-CM

## 2020-08-23 NOTE — Telephone Encounter (Signed)
RTC, saw Dr. Cyndie Chime yesterday and was placed on light duty X 1 week.  Her employer/HR is requesting she be written out of work this week, they do not have light duty.  Please call patient when letter is ready, she will have her daughter pick letter up. SChaplin, RN,BSN

## 2020-08-23 NOTE — Progress Notes (Signed)
Internal Medicine Clinic Attending  I saw and evaluated the patient.  I personally confirmed the key portions of the history and exam documented by Dr. Cyndie Chime and I reviewed pertinent patient test results.  The assessment, diagnosis, and plan were formulated together and I agree with the documentation in the resident's note.   Clinically seems to be moderate left knee osteoarthritis that is becoming function limiting. No effusion on exam today, no varus or valgus deformity. On xray, there seems to be moderate medial compartment joint space narrowing, best seen on the flexed PA view. We can offer the patient steroid injection in the future, certainly at risk to progress and require joint replacement in the future.

## 2020-08-23 NOTE — Telephone Encounter (Signed)
Pt is requesting a call back about a work note she stated that she was in yesterday 08/22/20 and saw Dr Cyndie Chime who put her on light duty but her job is  Requesting her to be out completely ( one week )   For a week due to her job has no light work to be done

## 2020-08-24 NOTE — Telephone Encounter (Signed)
New work note written and given to front desk

## 2020-08-28 NOTE — Progress Notes (Deleted)
Cardiology Office Note:   Date:  08/28/2020  NAME:  Laurie Contreras    MRN: [id number] DOB:  Oct 03, 1955   PCP:  Gust Rung, DO  Cardiologist:  No primary care provider on file.  Electrophysiologist:  None   Referring MD: Earl Lagos, MD   No chief complaint on file. ***  History of Present Illness:   Laurie Contreras is a 65 y.o. female with a hx of hypertension who is being seen today for the evaluation of chest pain at the request of Carlynn Purl, DO.  Admitted to hospital in February 2020 with atypical chest pain.  Troponins were negative.  Echocardiogram unremarkable.  Follow-up today.  Problem List 1. HTN 2. Pre-DM -A1c 6.4 3. HLD -T chol 128, HDL 28, LDL 63, TG 186  Past Medical History: Past Medical History:  Diagnosis Date  . Hypertension     Past Surgical History: Past Surgical History:  Procedure Laterality Date  . TONSILLECTOMY      Current Medications: No outpatient medications have been marked as taking for the 08/31/20 encounter (Appointment) with O'Neal, Ronnald Ramp, MD.     Allergies:    Naprosyn [naproxen]   Social History: Social History   Socioeconomic History  . Marital status: Divorced    Spouse name: Not on file  . Number of children: Not on file  . Years of education: Not on file  . Highest education level: Not on file  Occupational History  . Not on file  Tobacco Use  . Smoking status: Current Every Day Smoker    Packs/day: 0.50    Years: 40.00    Pack years: 20.00    Types: Cigarettes  . Smokeless tobacco: Never Used  . Tobacco comment: Cutting back  Substance and Sexual Activity  . Alcohol use: Yes    Alcohol/week: 1.0 standard drink    Types: 1 Cans of beer per week    Comment: Occasioally.  . Drug use: No  . Sexual activity: Not on file  Other Topics Concern  . Not on file  Social History Narrative  . Not on file   Social Determinants of Health   Financial Resource Strain: Not on file  Food Insecurity:  Not on file  Transportation Needs: Not on file  Physical Activity: Not on file  Stress: Not on file  Social Connections: Not on file     Family History: The patient's ***family history includes Heart attack in her sister; Hypertension in her father and mother; Stroke in her father and mother. There is no history of Breast cancer.  ROS:   All other ROS reviewed and negative. Pertinent positives noted in the HPI.     EKGs/Labs/Other Studies Reviewed:   The following studies were personally reviewed by me today:  EKG:  EKG is *** ordered today.  The ekg ordered today demonstrates ***, and was personally reviewed by me.   TTE 07/29/2020 1. Left ventricular ejection fraction, by estimation, is 60 to 65%. The  left ventricle has normal function. The left ventricle has no regional  wall motion abnormalities. Left ventricular diastolic parameters are  consistent with Grade I diastolic  dysfunction (impaired relaxation).  2. Right ventricular systolic function is normal. The right ventricular  size is normal. There is normal pulmonary artery systolic pressure.  3. The mitral valve is normal in structure. No evidence of mitral valve  regurgitation. No evidence of mitral stenosis.  4. The aortic valve is normal in structure. Aortic valve regurgitation is  not visualized. No aortic stenosis is present.  5. The inferior vena cava is normal in size with greater than 50%  respiratory variability, suggesting right atrial pressure of 3 mmHg.   Recent Labs: 07/28/2020: ALT 15 07/29/2020: Hemoglobin 12.1; Platelets 293 08/08/2020: BUN 10; Creatinine, Ser 0.81; Potassium 3.5; Sodium 142   Recent Lipid Panel    Component Value Date/Time   CHOL 128 07/29/2020 0435   CHOL 227 (H) 10/07/2019 1023   TRIG 186 (H) 07/29/2020 0435   HDL 28 (L) 07/29/2020 0435   HDL 42 10/07/2019 1023   CHOLHDL 4.6 07/29/2020 0435   VLDL 37 07/29/2020 0435   LDLCALC 63 07/29/2020 0435   LDLCALC 164 (H) 10/07/2019  1023    Physical Exam:   VS:  There were no vitals taken for this visit.   Wt Readings from Last 3 Encounters:  08/22/20 184 lb 12.8 oz (83.8 kg)  08/08/20 184 lb 12.8 oz (83.8 kg)  07/28/20 182 lb 1.6 oz (82.6 kg)    General: Well nourished, well developed, in no acute distress Head: Atraumatic, normal size  Eyes: PEERLA, EOMI  Neck: Supple, no JVD Endocrine: No thryomegaly Cardiac: Normal S1, S2; RRR; no murmurs, rubs, or gallops Lungs: Clear to auscultation bilaterally, no wheezing, rhonchi or rales  Abd: Soft, nontender, no hepatomegaly  Ext: No edema, pulses 2+ Musculoskeletal: No deformities, BUE and BLE strength normal and equal Skin: Warm and dry, no rashes   Neuro: Alert and oriented to person, place, time, and situation, CNII-XII grossly intact, no focal deficits  Psych: Normal mood and affect   ASSESSMENT:   Laurie Contreras is a 65 y.o. female who presents for the following: No diagnosis found.  PLAN:   There are no diagnoses linked to this encounter.  Disposition: No follow-ups on file.  Medication Adjustments/Labs and Tests Ordered: Current medicines are reviewed at length with the patient today.  Concerns regarding medicines are outlined above.  No orders of the defined types were placed in this encounter.  No orders of the defined types were placed in this encounter.   There are no Patient Instructions on file for this visit.   Time Spent with Patient: I have spent a total of *** minutes with patient reviewing hospital notes, telemetry, EKGs, labs and examining the patient as well as establishing an assessment and plan that was discussed with the patient.  > 50% of time was spent in direct patient care.  Signed, Lenna Gilford. Flora Lipps, MD, [facility]  [facility]  [address] [place], [place] [address] [phone]  08/28/2020 3:37 PM

## 2020-08-29 ENCOUNTER — Encounter: Payer: Self-pay | Admitting: Internal Medicine

## 2020-08-31 ENCOUNTER — Ambulatory Visit: Payer: BC Managed Care – PPO | Admitting: Cardiovascular Disease

## 2020-08-31 DIAGNOSIS — E782 Mixed hyperlipidemia: Secondary | ICD-10-CM

## 2020-08-31 DIAGNOSIS — R079 Chest pain, unspecified: Secondary | ICD-10-CM

## 2020-08-31 DIAGNOSIS — I10 Essential (primary) hypertension: Secondary | ICD-10-CM

## 2020-09-01 ENCOUNTER — Ambulatory Visit (INDEPENDENT_AMBULATORY_CARE_PROVIDER_SITE_OTHER): Payer: BC Managed Care – PPO | Admitting: Student

## 2020-09-01 ENCOUNTER — Encounter: Payer: Self-pay | Admitting: Student

## 2020-09-01 ENCOUNTER — Other Ambulatory Visit: Payer: Self-pay

## 2020-09-01 DIAGNOSIS — M25562 Pain in left knee: Secondary | ICD-10-CM | POA: Diagnosis not present

## 2020-09-01 NOTE — Assessment & Plan Note (Addendum)
Patient presents for follow-up of left knee pain which started about 1.5 months ago. DG of the left knee was obtained after last evaluation 10 days ago (08/22/20) showed moderate medial compartment joint space narrowing.   Patient reports she has ongoing pain in the knee despite continuing RICE therapy, Tylenol as needed, and topical Voltaren gel. She states the pain is stable from prior, better in the morning, worse at the end of the day. At work she has been given a stool to offload some of the pressure on the knee, and this is helping slightly.  On exam, there does appear to be a mild joint effusion and warmth of the left knee. No Baker's cyst noted. Limited range of motion of left knee. No change of the overlying skin.   A/P: Patient with moderate left knee osteoarthritis which has become function limiting over the last 1.5 months despite conservative management. Risks and benefits of steroid injection discussed with the patient. Knee injection performed by my colleague, Dr. Cyndie Chime. Aspiration was attempted, however after injecting lidocaine for numbing and proceeding with aspiration, patient could not tolerate the larger needle for aspiration.

## 2020-09-01 NOTE — Progress Notes (Signed)
   CC: ongoing left knee pain  HPI:  [name] is a 65 y.o. woman with history as below who presents to clinic for ongoing left knee pain. Her last clinic visit was on 08/22/20.   To see the details of this patient's management of their acute and chronic problems, please refer to the Assessment & Plan under the Encounters tab.    Past Medical History:  Diagnosis Date  . Hypertension    Review of Systems:    Review of Systems  Constitutional: Negative for chills and fever.  Musculoskeletal: Positive for joint pain.  Neurological: Negative for dizziness.   Physical Exam:  Vitals:   09/01/20 1337 09/01/20 1347  BP: (!) 154/100 133/74  Pulse: 85 84  Temp: 98.1 F (36.7 C)   TempSrc: Oral   SpO2: 98%   Weight: 184 lb 3.2 oz (83.6 kg)   Height: 5\' 7"  (1.702 m)    Constitutional: well-appearing woman sitting in chair, in no acute distress HENT: normocephalic atraumatic, mucous membranes moist Eyes: conjunctiva non-erythematous Neck: supple Pulmonary/Chest: normal work of breathing on room air MSK: normal bulk and tone; Mild joint effusion and warmth of the left knee. No Baker's cyst noted. Limited range of motion of left knee. No change of the overlying skin.  Neurological: alert & oriented x 3, antalgic gait Skin: warm and dry Psych: normal mood and affect   Assessment & Plan:   See Encounters Tab for problem-based charting.  Patient seen with Dr. 

## 2020-09-01 NOTE — Patient Instructions (Addendum)
Laurie Contreras,   Thank you for your visit to the [facility] today. It was a pleasure meeting you. Today we discussed the following:  1) Left knee pain, osteoarthritis - You received a left knee steroid injection today.  - Continue RICE therapy, Tylenol as needed, and topical Voltaren gel as needed  Please schedule follow-up with your primary care doctor, Dr. Mikey Bussing. Please bring all of your medications with you.   If you have any questions or concerns, please call our clinic at [phone] between 9am-5pm. Outside of these hours, call [phone] and ask for the internal medicine resident on call. If you feel you are having a medical emergency please call 911.

## 2020-09-01 NOTE — Progress Notes (Signed)
PROCEDURE NOTE  PROCEDURE: left knee joint steroid injection.  PREOPERATIVE DIAGNOSIS: Osteoarthritis of the left knee.  POSTOPERATIVE DIAGNOSIS: Osteoarthritis of the left knee.  PROCEDURE: The patient was apprised of the risks and the benefits of the procedure and informed consent was obtained, as witnessed by [employer] . Time-out procedure was performed, with confirmation of the patient's name, date of birth, and correct identification of the left knee to be injected. The patient's knee was then marked at the appropriate site for injection placement. The knee was sterilely prepped with Betadine. A 1 mg (1 milliliter) solution of Kenalog was drawn up into a 5 mL syringe with a 2 mL of 1% lidocaine. The patient was injected with a 25-gauge needle at the Superior medial aspect of her left extended knee. There were no complications. The patient tolerated the procedure well. There was minimal bleeding. The patient was instructed to ice her knee upon leaving clinic and refrain from overuse over the next 3 days. The patient was instructed to go to the emergency room with any usual pain, swelling, or redness occurred in the injected area. The patient was given a followup appointment to evaluate response to the injection to his increased range of motion and reduction of pain.  The original plan was to perform a knee aspiration and steroid injection.  However after injecting lidocaine for numbing and proceeding with aspiration, patient could not tolerate the big needle for aspiration.  She states that she felt the pressure when the needle advanced and was too anxious to proceed.  Therefore, we aborted the aspiration and performed the steroid injection, which happened smoothly.  Patient tolerated the procedure well.  The procedure was supervised by attending physician, Dr. Mikey Bussing.

## 2020-09-03 NOTE — Progress Notes (Signed)
Internal Medicine Clinic Attending  I saw and evaluated the patient.  I personally confirmed the key portions of the history and exam documented by Dr. Claudette Laws and I reviewed pertinent patient test results.  The assessment, diagnosis, and plan were formulated together and I agree with the documentation in the resident's note. I was present for the entire procedure with Dr Cyndie Chime, as noted given small effusion we had planned for aspiraiton however after numbing with 3cc of 1 % lidocaine using a 25 gauge 1.5in needle patient became anxious about the size of the 18 gauge needle.  Bevel was not even fully inserted before she started to scream in pain. We aborted plans for aspiration and using a 25 gauge needle were were able to inject 1cc of 40mg  kenalog along with 2cc of 1% lidocaine.

## 2020-10-03 NOTE — Progress Notes (Deleted)
Cardiology Office Note:   Date:  10/03/2020  NAME:  Laurie Contreras    MRN: [id number] DOB:  12/02/1955   PCP:  Gust Rung, DO  Cardiologist:  No primary care provider on file.  Electrophysiologist:  None   Referring MD: Earl Lagos, MD   No chief complaint on file.  History of Present Illness:   Laurie Contreras is a 65 y.o. female with a hx of tobacco abuse, prediabetes, hyperlipidemia who is being seen today for the evaluation of chest pain at the request of Gust Rung, DO.  Problem List 1. Tobacco abuse 2. Pre-DM -A1c 6.4 3. T chol 128, HDL 28, LDL 63, TG 186  Past Medical History: Past Medical History:  Diagnosis Date  . Hypertension     Past Surgical History: Past Surgical History:  Procedure Laterality Date  . TONSILLECTOMY      Current Medications: No outpatient medications have been marked as taking for the 10/04/20 encounter (Appointment) with O'Neal, Ronnald Ramp, MD.     Allergies:    Naprosyn [naproxen]   Social History: Social History   Socioeconomic History  . Marital status: Divorced    Spouse name: Not on file  . Number of children: Not on file  . Years of education: Not on file  . Highest education level: Not on file  Occupational History  . Not on file  Tobacco Use  . Smoking status: Current Every Day Smoker    Packs/day: 0.50    Years: 40.00    Pack years: 20.00    Types: Cigarettes  . Smokeless tobacco: Never Used  . Tobacco comment: Cutting back  Substance and Sexual Activity  . Alcohol use: Yes    Alcohol/week: 1.0 standard drink    Types: 1 Cans of beer per week    Comment: Occasioally.  . Drug use: No  . Sexual activity: Not on file  Other Topics Concern  . Not on file  Social History Narrative  . Not on file   Social Determinants of Health   Financial Resource Strain: Not on file  Food Insecurity: Not on file  Transportation Needs: Not on file  Physical Activity: Not on file  Stress: Not on file   Social Connections: Not on file     Family History: The patient's ***family history includes Heart attack in her sister; Hypertension in her father and mother; Stroke in her father and mother. There is no history of Breast cancer.  ROS:   All other ROS reviewed and negative. Pertinent positives noted in the HPI.     EKGs/Labs/Other Studies Reviewed:   The following studies were personally reviewed by me today:  EKG:  EKG is *** ordered today.  The ekg ordered today demonstrates ***, and was personally reviewed by me.   TTE 07/29/2020 1. Left ventricular ejection fraction, by estimation, is 60 to 65%. The  left ventricle has normal function. The left ventricle has no regional  wall motion abnormalities. Left ventricular diastolic parameters are  consistent with Grade I diastolic  dysfunction (impaired relaxation).  2. Right ventricular systolic function is normal. The right ventricular  size is normal. There is normal pulmonary artery systolic pressure.  3. The mitral valve is normal in structure. No evidence of mitral valve  regurgitation. No evidence of mitral stenosis.  4. The aortic valve is normal in structure. Aortic valve regurgitation is  not visualized. No aortic stenosis is present.  5. The inferior vena cava is normal in size  with greater than 50%  respiratory variability, suggesting right atrial pressure of 3 mmHg.   Recent Labs: 07/28/2020: ALT 15 07/29/2020: Hemoglobin 12.1; Platelets 293 08/08/2020: BUN 10; Creatinine, Ser 0.81; Potassium 3.5; Sodium 142   Recent Lipid Panel    Component Value Date/Time   CHOL 128 07/29/2020 0435   CHOL 227 (H) 10/07/2019 1023   TRIG 186 (H) 07/29/2020 0435   HDL 28 (L) 07/29/2020 0435   HDL 42 10/07/2019 1023   CHOLHDL 4.6 07/29/2020 0435   VLDL 37 07/29/2020 0435   LDLCALC 63 07/29/2020 0435   LDLCALC 164 (H) 10/07/2019 1023    Physical Exam:   VS:  There were no vitals taken for this visit.   Wt Readings from Last  3 Encounters:  09/01/20 184 lb 3.2 oz (83.6 kg)  08/22/20 184 lb 12.8 oz (83.8 kg)  08/08/20 184 lb 12.8 oz (83.8 kg)    General: Well nourished, well developed, in no acute distress Head: Atraumatic, normal size  Eyes: PEERLA, EOMI  Neck: Supple, no JVD Endocrine: No thryomegaly Cardiac: Normal S1, S2; RRR; no murmurs, rubs, or gallops Lungs: Clear to auscultation bilaterally, no wheezing, rhonchi or rales  Abd: Soft, nontender, no hepatomegaly  Ext: No edema, pulses 2+ Musculoskeletal: No deformities, BUE and BLE strength normal and equal Skin: Warm and dry, no rashes   Neuro: Alert and oriented to person, place, time, and situation, CNII-XII grossly intact, no focal deficits  Psych: Normal mood and affect   ASSESSMENT:   Laurie Contreras is a 65 y.o. female who presents for the following: No diagnosis found.  PLAN:   There are no diagnoses linked to this encounter.  Disposition: No follow-ups on file.  Medication Adjustments/Labs and Tests Ordered: Current medicines are reviewed at length with the patient today.  Concerns regarding medicines are outlined above.  No orders of the defined types were placed in this encounter.  No orders of the defined types were placed in this encounter.   There are no Patient Instructions on file for this visit.   Time Spent with Patient: I have spent a total of *** minutes with patient reviewing hospital notes, telemetry, EKGs, labs and examining the patient as well as establishing an assessment and plan that was discussed with the patient.  > 50% of time was spent in direct patient care.  Signed, Lenna Gilford. Flora Lipps, MD, [facility]  [facility]  [address], [address] [place], [place] [address] [phone]  10/03/2020 8:10 AM

## 2020-10-04 ENCOUNTER — Ambulatory Visit: Payer: BC Managed Care – PPO | Admitting: Cardiovascular Disease

## 2020-10-09 ENCOUNTER — Other Ambulatory Visit: Payer: Self-pay | Admitting: Internal Medicine

## 2020-10-09 DIAGNOSIS — I10 Essential (primary) hypertension: Secondary | ICD-10-CM

## 2020-11-02 ENCOUNTER — Telehealth: Payer: Self-pay | Admitting: Internal Medicine

## 2020-11-02 ENCOUNTER — Telehealth: Payer: Self-pay | Admitting: *Deleted

## 2020-11-02 NOTE — Telephone Encounter (Signed)
Pt is requesting a call back about Housing Resources.

## 2020-11-02 NOTE — Chronic Care Management (AMB) (Signed)
  Care Management   Outreach Note  11/02/2020 Name: Laurie Contreras MRN: [id number] DOB: November 29, 1955  Referred by: Gust Rung, DO Reason for referral : Care Coordination (Outreach to schedule initial call with BSW was unsuccessful)   An unsuccessful telephone outreach was attempted today. The patient was referred to the case management team for assistance with care management and care coordination.   Follow Up Plan: A HIPAA compliant phone message was left for the patient providing contact information and requesting a return call. The care management team will reach out to the patient again over the next 7 days. If patient returns call to provider office, please advise to call Embedded Care Management Care Guide Gwenevere Ghazi at [phone].  Gwenevere Ghazi  Care Guide, Embedded Care Coordination [facility] Management

## 2020-11-03 ENCOUNTER — Other Ambulatory Visit: Payer: Self-pay | Admitting: Student

## 2020-11-03 DIAGNOSIS — I10 Essential (primary) hypertension: Secondary | ICD-10-CM

## 2020-11-09 NOTE — Chronic Care Management (AMB) (Signed)
  Care Management   Outreach Note  11/09/2020 Name: Laurie Contreras MRN: [id number] DOB: 01-May-1956  Referred by: Gust Rung, DO Reason for referral : Care Coordination (Outreach to schedule initial call with BSW was unsuccessful)   A second unsuccessful telephone outreach was attempted today. The patient was referred to the case management team for assistance with care management and care coordination.   Follow Up Plan: A HIPAA compliant phone message was left for the patient providing contact information and requesting a return call. The care management team will reach out to the patient again over the next 7 days. If patient returns call to provider office, please advise to call Embedded Care Management Care Guide Gwenevere Ghazi at [phone].  Gwenevere Ghazi  Care Guide, Embedded Care Coordination [facility] Management

## 2020-11-18 NOTE — Chronic Care Management (AMB) (Signed)
  Care Management   Note  11/18/2020 Name: Laurie Contreras MRN: [id number] DOB: 10/20/1955  Laurie Contreras is a 65 y.o. year old female who is a primary care patient of Gust Rung, DO. I reached out to Gwendel Hanson by phone today in response to a referral sent by [name] PCP, Gust Rung, DO.    [name] was given information about care management services today including:  Care management services include personalized support from designated clinical staff supervised by her physician, including individualized plan of care and coordination with other care providers 24/7 contact phone numbers for assistance for urgent and routine care needs. The patient may stop care management services at any time by phone call to the office staff.  Patient agreed to services and verbal consent obtained.   Follow up plan: Telephone appointment with care management team member scheduled for: 11/23/2020  [facility] Guide, Embedded Care Coordination [facility]  Care Management  Direct Dial: [phone]

## 2020-11-22 ENCOUNTER — Other Ambulatory Visit: Payer: Self-pay

## 2020-11-22 ENCOUNTER — Ambulatory Visit: Payer: BC Managed Care – PPO | Admitting: Internal Medicine

## 2020-11-22 ENCOUNTER — Encounter: Payer: Self-pay | Admitting: Internal Medicine

## 2020-11-22 VITALS — BP 148/73 | HR 80 | Temp 98.2°F | Ht 67.0 in | Wt 186.2 lb

## 2020-11-22 DIAGNOSIS — M25562 Pain in left knee: Secondary | ICD-10-CM | POA: Diagnosis not present

## 2020-11-23 ENCOUNTER — Ambulatory Visit: Payer: BC Managed Care – PPO | Admitting: Licensed Clinical Social Worker

## 2020-11-23 MED ORDER — OMEPRAZOLE 40 MG PO CPDR: 40.0000 mg | DELAYED_RELEASE_CAPSULE | Freq: Every day | ORAL | 0 refills | Status: DC

## 2020-11-23 NOTE — Patient Instructions (Signed)
Visit Information  Instructions: patient will work with SW to address concerns related to housing.  Patient was given the following information about care management and care coordination services today, agreed to services, and gave verbal consent: 1.care management/care coordination services include personalized support from designated clinical staff supervised by their physician, including individualized plan of care and coordination with other care providers 2. 24/7 contact phone numbers for assistance for urgent and routine care needs. 3. The patient may stop care management/care coordination services at any time by phone call to the office staff.  Patient verbalizes understanding of instructions provided today and agrees to view in MyChart.   Telephone follow up appointment with care management team member scheduled for: within the next 30-days  Christen Butter, BSW  Social Worker [facility] Care Management  [phone]

## 2020-11-23 NOTE — Progress Notes (Signed)
Office Visit   Patient ID: Laurie Contreras, female    DOB: 04-25-1956, 65 y.o.   MRN: [id number]   PCP: Gust Rung, DO   Subjective:   Laurie Contreras is a 65 y.o. year old female who presents for follow up of left knee pain. Please refer to problem based charting for assessment and plan.      ACTIVE MEDICATIONS   Outpatient Medications Prior to Visit  Medication Sig Dispense Refill   amLODipine (NORVASC) 10 MG tablet Take 1 tablet (10 mg total) by mouth daily. 90 tablet 1   atorvastatin (LIPITOR) 40 MG tablet Take 1 tablet (40 mg total) by mouth daily. 90 tablet 3   diphenhydrAMINE (BENADRYL) 25 mg capsule Take 2 capsules (50 mg total) by mouth every 6 (six) hours as needed for allergies. 30 capsule 1   losartan (COZAAR) 25 MG tablet TAKE 1 TABLET(25 MG) BY MOUTH DAILY 90 tablet 0   Multiple Vitamin (MULTIVITAMIN WITH MINERALS) TABS tablet Take 1 tablet by mouth daily.     omeprazole (PRILOSEC) 40 MG capsule Take 1 capsule (40 mg total) by mouth daily. 90 capsule 0   No facility-administered medications prior to visit.     Objective:   BP (!) 148/73 (BP Location: Left Arm, Patient Position: Sitting, Cuff Size: Normal)   Pulse 80   Temp 98.2 F (36.8 C) (Oral)   Ht 5\' 7"  (1.702 m)   Wt 186 lb 3.2 oz (84.5 kg)   SpO2 93%   BMI 29.16 kg/m  Wt Readings from Last 3 Encounters:  11/22/20 186 lb 3.2 oz (84.5 kg)  09/01/20 184 lb 3.2 oz (83.6 kg)  08/22/20 184 lb 12.8 oz (83.8 kg)   BP Readings from Last 3 Encounters:  11/22/20 (!) 148/73  09/01/20 133/74  08/22/20 123/65   General: well appearing CV: +1 LE edema bilaterally Left knee: no obvious deformity. No erythema or elevated warmth. Mild effusion, if any. Severe pain with palpation of the entire knee joint, greater superiorly. Active knee extension illicits pain but is intact. Active knee flexion is more limited due to pain. Unable to tolerate ligamentous exams  Health Maintenance:   Health Maintenance   Topic Date Due   PAP SMEAR-Modifier  Never done   Zoster Vaccines- Shingrix (1 of 2) Never done   COLONOSCOPY (Pts 45-39yrs Insurance coverage will need to be confirmed)  03/19/2020   DEXA SCAN  Never done   COVID-19 Vaccine (4 - Booster for Pfizer series) 07/06/2020   INFLUENZA VACCINE  01/02/2021   PNA vac Low Risk Adult (2 of 2 - PPSV23) 08/08/2021   MAMMOGRAM  10/27/2021   TETANUS/TDAP  08/09/2030   Hepatitis C Screening  Completed   HIV Screening  Completed   HPV VACCINES  Aged Out     Assessment & Plan:   Problem List Items Addressed This Visit       Other   Left knee pain - Primary    She presents for follow up of left knee pain. Pain improved after her last cortisone shot in March however worsened again last week. She experiences knee instability and feels that it locks up sometimes. Pain limited a thorough exam today. Assessment: given the nature of her injury, question meniscus or ligamental injury. Plan -MRI left knee  -intraarticular steroid injection provided today       Relevant Orders   MR Knee Left  Wo Contrast     Follow up with Dr. April after  completion of MRI for review of results and further management.   Pt discussed with Dr. Leo Rod, MD Internal Medicine Resident PGY-2 [facility] Pager: [phone] 11/23/2020 9:24 AM

## 2020-11-23 NOTE — Progress Notes (Signed)
Left knee intraarticular steroid injection procedure note  Pre-operative Diagnosis: left knee pain  Indications: left knee pain  Anesthesia: Lidocaine 1% with epinephrine     Procedure Details   After risks and benefits were reviewed with Laurie Contreras, consent form was signed. Time out was performed. The left knee was prepped with iodine. A 25g needle was introduced into the intraarticular space, using a lateral approach. and was injected with 58mL 1% lidocaine and 40mg  of kenolog. The needle was removed and dressing applied.   Complications:  None; patient tolerated the procedure well.  She is aware to notify the clinic for signs or symptoms of infection at the injected site.  , MD Internal Medicine Resident PGY-2 [facility] Pager: [phone] 11/23/2020 12:22 PM

## 2020-11-23 NOTE — Assessment & Plan Note (Addendum)
She presents for follow up of left knee pain. Pain improved after her last cortisone shot in March however worsened again last week. She experiences knee instability and feels that it locks up sometimes.  Assessment: Pain limited a thorough exam today. given the nature of her injury, question meniscus or ligamental injury. VTE considered--Well's score 0 Plan -MRI left knee  -intraarticular steroid injection provided today -she will follow up with Dr. Mikey Bussing after MRI completion

## 2020-11-23 NOTE — Chronic Care Management (AMB) (Signed)
  Care Management   Social Work Visit Note  11/23/2020 Name: Laurie Contreras MRN: [id number] DOB: 21-Mar-1956  Laurie Contreras is a 65 y.o. year old female who sees Gust Rung, DO for primary care. The care management team was consulted for assistance with care management and care coordination needs related to [facility] Resources    Patient was given the following information about care management and care coordination services today, agreed to services, and gave verbal consent: 1.care management/care coordination services include personalized support from designated clinical staff supervised by their physician, including individualized plan of care and coordination with other care providers 2. 24/7 contact phone numbers for assistance for urgent and routine care needs. 3. The patient may stop care management/care coordination services at any time by phone call to the office staff.  Engaged with patient by telephone for initial visit in response to provider referral for social work chronic care management and care coordination services.  Assessment: Review of patient history, allergies, and health status during evaluation of patient need for care management/care coordination services.    Interventions:  Patient interviewed and appropriate assessments performed Collaborated with clinical team regarding patient needs  Patient advised her house was sold and she will be required to vacate the home in the future. Patient stated the new landlord is working with her and not requesting her to leave at this time. Patient stated she has an older daughter, younger son and friends who are assisting her looking for a new residence. Patient stated if she is unable to find a new home, she is able to move in with her daughter.  Patient does not receive disability. Patient does not receive section 8 or any housing benefits. Patient is a full time employee and planning to retire this year.  Patient stated she can  afford $900 in rent and looking for a 2 bedroom home in [place], [place]. Patient denied any  barriers with food or transportation.   SDOH (Social Determinants of Health) assessments performed: Yes     Plan:  patient will work with BSW to address needs related to Housing barriers Child psychotherapist will follow up with patient within 30 days. .  Patient will notify SW if housing is secured.   Christen Butter, BSW  Social Worker [facility] Care Management  [phone]

## 2020-12-01 ENCOUNTER — Ambulatory Visit: Payer: BC Managed Care – PPO | Admitting: Cardiology

## 2020-12-06 ENCOUNTER — Encounter: Payer: Self-pay | Admitting: *Deleted

## 2020-12-26 ENCOUNTER — Ambulatory Visit: Payer: BC Managed Care – PPO | Admitting: Licensed Clinical Social Worker

## 2020-12-26 NOTE — Patient Instructions (Signed)
Visit Information  Instructions:   Patient was given the following information about care management and care coordination services today, agreed to services, and gave verbal consent: 1.care management/care coordination services include personalized support from designated clinical staff supervised by their physician, including individualized plan of care and coordination with other care providers 2. 24/7 contact phone numbers for assistance for urgent and routine care needs. 3. The patient may stop care management/care coordination services at any time by phone call to the office staff.  Patient verbalizes understanding of instructions provided today and agrees to view in MyChart.   No further follow up required: .  Victoire Deans, BSW  Social Worker [facility] Care Management  [phone]      

## 2020-12-26 NOTE — Chronic Care Management (AMB) (Signed)
  Care Management   Social Work Visit Note  12/26/2020 Name: Laurie Contreras MRN: [id number] DOB: September 27, 1955  Laurie Contreras is a 65 y.o. year old female who sees Gust Rung, DO for primary care. The care management team was consulted for assistance with care management and care coordination needs related to  Housing.    Patient was given the following information about care management and care coordination services today, agreed to services, and gave verbal consent: 1.care management/care coordination services include personalized support from designated clinical staff supervised by their physician, including individualized plan of care and coordination with other care providers 2. 24/7 contact phone numbers for assistance for urgent and routine care needs. 3. The patient may stop care management/care coordination services at any time by phone call to the office staff.  Engaged with patient by telephone for follow up visit in response to provider referral for social work chronic care management and care coordination services.  Assessment: Review of patient history, allergies, and health status during evaluation of patient need for care management/care coordination services.    Interventions:  Patient interviewed and appropriate assessments performed Collaborated with clinical team regarding patient needs  Patient has secured housing. Patient declined needing any additional resources. Patient advised she has an adequate income to purchase groceries and transportation.  Patient agreed to contact SW in the future if any additional resources are needed.       Plan:   No further follow up required: Patient has SW contact information.  Christen Butter, BSW  Social Worker [facility] Management  [phone]

## 2021-01-09 ENCOUNTER — Other Ambulatory Visit: Payer: Self-pay | Admitting: Internal Medicine

## 2021-01-09 DIAGNOSIS — Z1231 Encounter for screening mammogram for malignant neoplasm of breast: Secondary | ICD-10-CM

## 2021-01-10 ENCOUNTER — Ambulatory Visit: Payer: BC Managed Care – PPO

## 2021-01-27 ENCOUNTER — Ambulatory Visit: Payer: BC Managed Care – PPO | Admitting: Cardiology

## 2021-02-03 ENCOUNTER — Other Ambulatory Visit: Payer: Self-pay | Admitting: *Deleted

## 2021-02-03 DIAGNOSIS — I10 Essential (primary) hypertension: Secondary | ICD-10-CM

## 2021-02-03 MED ORDER — LOSARTAN POTASSIUM 25 MG PO TABS: ORAL_TABLET | ORAL | 0 refills | Status: DC

## 2021-02-28 ENCOUNTER — Other Ambulatory Visit: Payer: Self-pay

## 2021-02-28 ENCOUNTER — Ambulatory Visit
Admission: RE | Admit: 2021-02-28 | Discharge: 2021-02-28 | Disposition: A | Discharge status: Home / Self Care | Payer: BC Managed Care – PPO | Source: Ambulatory Visit | Attending: Internal Medicine | Admitting: Internal Medicine

## 2021-02-28 DIAGNOSIS — Z1231 Encounter for screening mammogram for malignant neoplasm of breast: Secondary | ICD-10-CM | POA: Diagnosis not present

## 2021-03-06 ENCOUNTER — Other Ambulatory Visit: Payer: Self-pay | Admitting: Internal Medicine

## 2021-03-06 DIAGNOSIS — R928 Other abnormal and inconclusive findings on diagnostic imaging of breast: Secondary | ICD-10-CM

## 2021-03-29 ENCOUNTER — Ambulatory Visit: Payer: BC Managed Care – PPO | Admitting: Cardiovascular Disease

## 2021-04-17 ENCOUNTER — Other Ambulatory Visit: Payer: Self-pay

## 2021-04-17 DIAGNOSIS — I10 Essential (primary) hypertension: Secondary | ICD-10-CM

## 2021-04-17 MED ORDER — AMLODIPINE BESYLATE 10 MG PO TABS
10.0000 mg | ORAL_TABLET | Freq: Every day | ORAL | 1 refills | Status: DC
Start: 2021-04-17 — End: 2022-01-24

## 2021-04-17 NOTE — Telephone Encounter (Signed)
Next available time for her to get back to see me would be fine

## 2021-04-20 NOTE — Progress Notes (Signed)
Laurie Ok, DO Reason for referral-chest pain  HPI: 65 year old female for evaluation of chest pain at request of Carlynn Purl, DO.  Echocardiogram February 2022 showed normal LV function and grade 1 diastolic dysfunction.  Patient states that approximately 6 months ago she had gas in her chest.  There was an uncomfortable sensation without radiation.  No associated symptoms.  Lasted through the night and the next day.  She has had no symptoms since then.  She is very active and denies exertional chest pain.  There is no dyspnea on exertion, orthopnea, PND, pedal edema or syncope.  Cardiology asked to evaluate.  Current Outpatient Medications  Medication Sig Dispense Refill   amLODipine (NORVASC) 10 MG tablet Take 1 tablet (10 mg total) by mouth daily. 90 tablet 1   atorvastatin (LIPITOR) 40 MG tablet Take 1 tablet (40 mg total) by mouth daily. 90 tablet 3   diphenhydrAMINE (BENADRYL) 25 mg capsule Take 2 capsules (50 mg total) by mouth every 6 (six) hours as needed for allergies. 30 capsule 1   losartan (COZAAR) 25 MG tablet TAKE 1 TABLET(25 MG) BY MOUTH DAILY 90 tablet 0   Multiple Vitamin (MULTIVITAMIN WITH MINERALS) TABS tablet Take 1 tablet by mouth daily.     omeprazole (PRILOSEC) 40 MG capsule Take 1 capsule (40 mg total) by mouth daily. 90 capsule 0   No current facility-administered medications for this visit.    Allergies  Allergen Reactions   Naprosyn [Naproxen] Itching     Past Medical History:  Diagnosis Date   Hypertension     Past Surgical History:  Procedure Laterality Date   TONSILLECTOMY      Social History   Socioeconomic History   Marital status: Divorced    Spouse name: Not on file   Number of children: Not on file   Years of education: Not on file   Highest education level: Not on file  Occupational History   Not on file  Tobacco Use   Smoking status: Every Day    Packs/day: 0.50    Years: 40.00    Pack years: 20.00    Types:  Cigarettes   Smokeless tobacco: Never   Tobacco comments:    Cutting back  Substance and Sexual Activity   Alcohol use: Yes    Alcohol/week: 1.0 standard drink    Types: 1 Cans of beer per week    Comment: Occasionally.   Drug use: No   Sexual activity: Not on file  Other Topics Concern   Not on file  Social History Narrative   Not on file   Social Determinants of Health   Financial Resource Strain: Not on file  Food Insecurity: Not on file  Transportation Needs: Not on file  Physical Activity: Not on file  Stress: Not on file  Social Connections: Not on file  Intimate Partner Violence: Not on file    Family History  Problem Relation Age of Onset   Hypertension Mother    Stroke Mother    Hypertension Father    Stroke Father    Heart attack Sister    Breast cancer Neg Hx     ROS: no fevers or chills, productive cough, hemoptysis, dysphasia, odynophagia, melena, hematochezia, dysuria, hematuria, rash, seizure activity, orthopnea, PND, pedal edema, claudication. Remaining systems are negative.  Physical Exam:   Blood pressure 132/68, pulse 79, height 5\' 7"  (1.702 m), weight 176 lb 9.6 oz (80.1 kg), SpO2 97 %.  General:  Well developed/well nourished in  NAD Skin warm/dry Patient not depressed No peripheral clubbing Back-normal HEENT-normal/normal eyelids Neck supple/normal carotid upstroke bilaterally; no bruits; no JVD; no thyromegaly chest - CTA/ normal expansion CV - RRR/normal S1 and S2; no murmurs, rubs or gallops;  PMI nondisplaced Abdomen -NT/ND, no HSM, no mass, + bowel sounds, no bruit 2+ femoral pulses, no bruits Ext-no edema, chords, 2+ DP Neuro-grossly nonfocal  ECG -normal sinus rhythm at a rate of 79, nonspecific ST changes.  Personally reviewed  A/P  1 chest pain-symptoms extremely atypical and unlikely to be cardiac.  She does have a strong family history of coronary disease and I will arrange a calcium score for risk stratification.  2  hypertension-blood pressure controlled.  Continue present medications and follow.  3 Hyperlipidemia-continue statin.  4 tobacco abuse-patient counseled on discontinuing.  Kirk Ruths, MD

## 2021-05-02 ENCOUNTER — Encounter: Payer: Self-pay | Admitting: Cardiology

## 2021-05-02 ENCOUNTER — Ambulatory Visit: Payer: BC Managed Care – PPO | Admitting: Cardiology

## 2021-05-02 ENCOUNTER — Other Ambulatory Visit: Payer: Self-pay

## 2021-05-02 VITALS — BP 132/68 | HR 79 | Ht 67.0 in | Wt 176.6 lb

## 2021-05-02 DIAGNOSIS — I10 Essential (primary) hypertension: Secondary | ICD-10-CM | POA: Diagnosis not present

## 2021-05-02 DIAGNOSIS — E7800 Pure hypercholesterolemia, unspecified: Secondary | ICD-10-CM

## 2021-05-02 DIAGNOSIS — R072 Precordial pain: Secondary | ICD-10-CM

## 2021-05-02 NOTE — Patient Instructions (Signed)
  Testing/Procedures:  CORONARY CT CALCIUM SCORE @ [facility]   Follow-Up: At [facility], you and your health needs are our priority.  As part of our continuing mission to provide you with exceptional heart care, we have created designated Provider Care Teams.  These Care Teams include your primary Cardiologist (physician) and Advanced Practice Providers (APPs -  Physician Assistants and Nurse Practitioners) who all work together to provide you with the care you need, when you need it.  We recommend signing up for the patient portal called "MyChart".  Sign up information is provided on this After Visit Summary.  MyChart is used to connect with patients for Virtual Visits (Telemedicine).  Patients are able to view lab/test results, encounter notes, upcoming appointments, etc.  Non-urgent messages can be sent to your provider as well.   To learn more about what you can do with MyChart, go to [email or web address].    Your next appointment:    AS NEEDED

## 2021-05-09 ENCOUNTER — Telehealth: Payer: Self-pay | Admitting: Internal Medicine

## 2021-05-09 NOTE — Telephone Encounter (Signed)
Received Letter from [facility] that patient has not completed follow up of her abnormal screening mammogram from 02/28/21.  Attempted call for this but no answer.  Will forward this message to triage in case she calls back.

## 2021-05-17 ENCOUNTER — Telehealth (HOSPITAL_BASED_OUTPATIENT_CLINIC_OR_DEPARTMENT_OTHER): Payer: Self-pay | Admitting: Cardiology

## 2021-05-17 NOTE — Telephone Encounter (Signed)
Left message regarding the Friday 06/16/21 8:15 am Calcium Scoring appointment scheduled at [facility]--[address], [address]--arrival time is 8:00 am for check in.  Will mail information to patient and requested she call with questions or concerns.

## 2021-06-16 ENCOUNTER — Other Ambulatory Visit: Payer: Self-pay

## 2021-07-19 ENCOUNTER — Inpatient Hospital Stay: Admission: RE | Admit: 2021-07-19 | Payer: Self-pay | Source: Ambulatory Visit

## 2022-01-23 NOTE — Progress Notes (Unsigned)
   CC: Blood Pressure Follow-up  HPI:   [name] is a 66 y.o. female past medical history of hypertension, tobacco use, and left knee pain who presents for follow-up on blood pressure.  She was last seen at [facility] in June 2022.      Past Medical History:  Diagnosis Date   Hypertension      Review of Systems:    Reports mild lightheadedness, knee pain Denies fever, chills, sweats, blurry vision, dizziness, syncope    Physical Exam:  Vitals:   01/24/22 1023 01/24/22 1043  BP: (!) 178/83 (!) 182/92  Pulse: 75 75  Temp: 98.2 F (36.8 C)   TempSrc: Oral   SpO2: 96%   Weight: 171 lb (77.6 kg)   Height: 5\' 7"  (1.702 m)     General:   awake and alert, sitting comfortably in chair, cooperative, not in acute distress Lungs:   normal respiratory effort, breathing unlabored, symmetrical chest rise, no crackles or wheezing Cardiac:   regular rate and rhythm, normal S1 and S2 Psychiatric:   mood and affect normal, intelligible speech    Assessment & Plan:   Essential hypertension Patient has hypertension, which has been well controlled on amlodipine 10 mg and losartan 25 mg daily.  She ran out of these medications 2 weeks ago and has been off of them since.  In clinic her blood pressure was 178/83 and repeat measurement was 182/92.  She been experiencing some mild lightheadedness since running out of her medications but denies any blurry vision, dizziness, falls, and syncope.  She intends to take her medications and only needed a refill.  She also just ordered a blood pressure cuff so that she can check her blood pressure at home.  -Refill amlodipine 10 mg and losartan 25 mg -Encourage daily use of blood pressure cuff at home and bring values to next visit in one month -Check BMP at next visit in about 1 month to evaluate renal function   Hyperlipidemia Patient's last lipid panel was in February 2022.  It revealed decreased HDL, increased triglycerides, and normal LDL  levels.  She is currently on atorvastatin 40 mg daily.  -Refill atorvastatin and order lipid panel   OA (osteoarthritis) of knee Patient was reporting left knee pain at her last visit in June 2022.  At that visit, she was administered an intra-articular corticosteroid injection.  She continues to experience some relief from the injection, which she continues to successfully self-manage with Tylenol as needed, periodic elevation, and wearing a knee brace.  Radiograph of her left knee performed in March 2022 revealed normal medial compartment osteoarthritis.  -Continue to take Tylenol as needed, elevate, and wear a knee brace -Return to clinic if symptoms worsen for possible repeat intra-articular steroid injection    Healthcare Maintenance -Referral to gastroenterology for routine screening colonoscopy, last one was in 2018 -Encourage reducing tobacco use further, now down to 0.5ppd     See Encounters Tab for problem based charting.  Patient seen with Dr.  2019

## 2022-01-24 ENCOUNTER — Encounter: Payer: Self-pay | Admitting: Student

## 2022-01-24 ENCOUNTER — Ambulatory Visit: Payer: BC Managed Care – PPO | Admitting: Student

## 2022-01-24 VITALS — BP 182/92 | HR 75 | Temp 98.2°F | Ht 67.0 in | Wt 171.0 lb

## 2022-01-24 DIAGNOSIS — Z0000 Encounter for general adult medical examination without abnormal findings: Secondary | ICD-10-CM

## 2022-01-24 DIAGNOSIS — M1712 Unilateral primary osteoarthritis, left knee: Secondary | ICD-10-CM | POA: Diagnosis not present

## 2022-01-24 DIAGNOSIS — E782 Mixed hyperlipidemia: Secondary | ICD-10-CM | POA: Diagnosis not present

## 2022-01-24 DIAGNOSIS — E785 Hyperlipidemia, unspecified: Secondary | ICD-10-CM | POA: Diagnosis not present

## 2022-01-24 DIAGNOSIS — F17210 Nicotine dependence, cigarettes, uncomplicated: Secondary | ICD-10-CM

## 2022-01-24 DIAGNOSIS — R7303 Prediabetes: Secondary | ICD-10-CM

## 2022-01-24 DIAGNOSIS — I10 Essential (primary) hypertension: Secondary | ICD-10-CM

## 2022-01-24 LAB — POCT GLYCOSYLATED HEMOGLOBIN (HGB A1C): Hemoglobin A1C: 6.3 % — AB (ref 4.0–5.6)

## 2022-01-24 LAB — GLUCOSE, CAPILLARY: Glucose-Capillary: 126 mg/dL — ABNORMAL HIGH (ref 70–99)

## 2022-01-24 MED ORDER — AMLODIPINE BESYLATE 10 MG PO TABS: 10.0000 mg | ORAL_TABLET | Freq: Every day | ORAL | 1 refills | Status: DC

## 2022-01-24 MED ORDER — LOSARTAN POTASSIUM 25 MG PO TABS: ORAL_TABLET | ORAL | 0 refills | Status: DC

## 2022-01-24 MED ORDER — ATORVASTATIN CALCIUM 40 MG PO TABS
40.0000 mg | ORAL_TABLET | Freq: Every day | ORAL | 3 refills | Status: DC
Start: 2022-01-24 — End: 2022-11-05

## 2022-01-24 NOTE — Patient Instructions (Signed)
  Thank you, [name], for allowing [place] to provide your care today. Today we discussed . . .  > Hypertension       - we have refilled your blood pressure medications, please continue taking them as prescribed       - please also start checking your blood pressure medications at home with your new cuff       - we will see you back in clinic in about one month > Prediabetes       - we ordered an A1C for you today, which will tell us if you have diabetes > Knee Pain       - it is good to hear that your knee is doing okay, please let us know if you need another steroid injection   I have ordered the following labs for you:   Lab Orders         Lipid Profile         POC Hbg A1C       Tests ordered today:  None   Referrals ordered today:    Referral Orders         Ambulatory referral to Gastroenterology       I have ordered the following medication/changed the following medications:   Stop the following medications: Medications Discontinued During This Encounter  Medication Reason   atorvastatin (LIPITOR) 40 MG tablet Reorder   losartan (COZAAR) 25 MG tablet Reorder   amLODipine (NORVASC) 10 MG tablet Reorder     Start the following medications: Meds ordered this encounter  Medications   amLODipine (NORVASC) 10 MG tablet    Sig: Take 1 tablet (10 mg total) by mouth daily.    Dispense:  90 tablet    Refill:  1   atorvastatin (LIPITOR) 40 MG tablet    Sig: Take 1 tablet (40 mg total) by mouth daily.    Dispense:  90 tablet    Refill:  3   losartan (COZAAR) 25 MG tablet    Sig: TAKE 1 TABLET(25 MG) BY MOUTH DAILY    Dispense:  90 tablet    Refill:  0      Follow up:  1 month     Remember:  Please continue to take your medications and start taking your blood pressure at home. We will see you again in about one month!   Should you have any questions or concerns please call the internal medicine clinic at [phone].     Lajuana Ripple, MD [facility]  Internal Medicine Center

## 2022-01-24 NOTE — Assessment & Plan Note (Signed)
Patient has hypertension, which has been well controlled on amlodipine 10 mg and losartan 25 mg daily.  She ran out of these medications 2 weeks ago and has been off of them since.  In clinic her blood pressure was 178/83 and repeat measurement was 182/92.  She been experiencing some mild lightheadedness since running out of her medications but denies any blurry vision, dizziness, falls, and syncope.  She intends to take her medications and only needed a refill.  She also just ordered a blood pressure cuff so that she can check her blood pressure at home.  -Refill amlodipine 10 mg and losartan 25 mg -Encourage daily use of blood pressure cuff at home and bring values to next visit in one month -Check BMP at next visit in about 1 month to evaluate renal function

## 2022-01-24 NOTE — Assessment & Plan Note (Addendum)
Patient was reporting left knee pain at her last visit in June 2022.  At that visit, she was administered an intra-articular corticosteroid injection.  She continues to experience some relief from the injection, which she continues to successfully self-manage with Tylenol as needed, periodic elevation, and wearing a knee brace.  Radiograph of her left knee performed in March 2022 revealed normal medial compartment osteoarthritis.  -Continue to take Tylenol as needed, elevate, and wear a knee brace -Return to clinic if symptoms worsen for possible repeat intra-articular steroid injection

## 2022-01-24 NOTE — Assessment & Plan Note (Signed)
Patient's last lipid panel was in February 2022.  It revealed decreased HDL, increased triglycerides, and normal LDL levels.  She is currently on atorvastatin 40 mg daily.  -Refill atorvastatin and order lipid panel

## 2022-01-25 LAB — LIPID PANEL
Chol/HDL Ratio: 6 ratio — ABNORMAL HIGH (ref 0.0–4.4)
Cholesterol, Total: 197 mg/dL (ref 100–199)
HDL: 33 mg/dL — ABNORMAL LOW (ref >39)
LDL Chol Calc (NIH): 134 mg/dL — ABNORMAL HIGH (ref 0–99)
Triglycerides: 168 mg/dL — ABNORMAL HIGH (ref 0–149)
VLDL Cholesterol Cal: 30 mg/dL (ref 5–40)

## 2022-01-25 NOTE — Addendum Note (Signed)
Addended by: Dickie La on: 01/25/2022 11:43 AM   Modules accepted: Level of Service

## 2022-01-25 NOTE — Progress Notes (Signed)
Internal Medicine Clinic Attending  I saw and evaluated the patient.  I personally confirmed the key portions of the history and exam documented by Dr. Harper and I reviewed pertinent patient test results.  The assessment, diagnosis, and plan were formulated together and I agree with the documentation in the resident's note.  

## 2022-02-21 ENCOUNTER — Encounter: Payer: Self-pay | Admitting: Student

## 2022-02-21 ENCOUNTER — Ambulatory Visit: Payer: BC Managed Care – PPO | Admitting: Student

## 2022-02-21 DIAGNOSIS — F17210 Nicotine dependence, cigarettes, uncomplicated: Secondary | ICD-10-CM

## 2022-02-21 DIAGNOSIS — I10 Essential (primary) hypertension: Secondary | ICD-10-CM

## 2022-02-21 MED ORDER — LOSARTAN POTASSIUM 50 MG PO TABS: 50.0000 mg | ORAL_TABLET | Freq: Every day | ORAL | 3 refills | Status: DC

## 2022-02-21 NOTE — Progress Notes (Signed)
CC: Hypertension  HPI:  [name] is a 66 y.o. female living with a history stated below and presents today for hypertension follow-up. Please see problem based assessment and plan for additional details.  Past Medical History:  Diagnosis Date   Hypertension     Current Outpatient Medications on File Prior to Visit  Medication Sig Dispense Refill   amLODipine (NORVASC) 10 MG tablet Take 1 tablet (10 mg total) by mouth daily. 90 tablet 1   atorvastatin (LIPITOR) 40 MG tablet Take 1 tablet (40 mg total) by mouth daily. 90 tablet 3   diphenhydrAMINE (BENADRYL) 25 mg capsule Take 2 capsules (50 mg total) by mouth every 6 (six) hours as needed for allergies. 30 capsule 1   losartan (COZAAR) 25 MG tablet TAKE 1 TABLET(25 MG) BY MOUTH DAILY 90 tablet 0   Multiple Vitamin (MULTIVITAMIN WITH MINERALS) TABS tablet Take 1 tablet by mouth daily.     omeprazole (PRILOSEC) 40 MG capsule Take 1 capsule (40 mg total) by mouth daily. 90 capsule 0   No current facility-administered medications on file prior to visit.    Family History  Problem Relation Age of Onset   Hypertension Mother    Stroke Mother    Hypertension Father    Stroke Father    Heart attack Sister    Breast cancer Neg Hx     Social History   Socioeconomic History   Marital status: Divorced    Spouse name: Not on file   Number of children: Not on file   Years of education: Not on file   Highest education level: Not on file  Occupational History   Not on file  Tobacco Use   Smoking status: Every Day    Packs/day: 0.50    Years: 40.00    Total pack years: 20.00    Types: Cigarettes   Smokeless tobacco: Never   Tobacco comments:    Cutting back  Substance and Sexual Activity   Alcohol use: Yes    Alcohol/week: 1.0 standard drink of alcohol    Types: 1 Cans of beer per week    Comment: Occasionally.   Drug use: No   Sexual activity: Not on file  Other Topics Concern   Not on file  Social History  Narrative   Not on file   Social Determinants of Health   Financial Resource Strain: Not on file  Food Insecurity: Not on file  Transportation Needs: Not on file  Physical Activity: Not on file  Stress: Not on file  Social Connections: Not on file  Intimate Partner Violence: Not on file    Review of Systems: ROS negative except for what is noted on the assessment and plan.  Vitals:   02/21/22 1027  BP: (!) 140/74  Pulse: 80  Temp: 97.7 F (36.5 C)  TempSrc: Oral  SpO2: 96%  Weight: 171 lb 6.4 oz (77.7 kg)  Height: 5\' 7"  (1.702 m)    Physical Exam: Constitutional: well-appearing female, in no acute distress Cardiovascular: regular rate and rhythm, no m/r/g Pulmonary/Chest: normal work of breathing on room air, lungs clear to auscultation bilaterally Skin: warm and dry   Assessment & Plan:   Essential hypertension Patient's blood pressure today in the clinic was 140/74, noticeably down from her previous visit on August 23 where her pressure was 178/83.  She denies any blurry vision, dizziness, falls, chest pain.  Distal pulses are intact.  Blood pressure is slightly above goal of systolic below 295.  She also has not had a basic metabolic panel done in over a year.  Blood pressure at home has been in the 130s also elevated systolic.    Plan: - Continue amlodipine 10 mg - Advised patient to take 2 doses of her losartan at once, for 50 mg. - I already sent order with pharmacy note upon patient's next refill she should receive losartan 50 mg - We will update patient on lab results    Patient seen with Dr. Rockey Situ, M.D. [facility] Internal Medicine, PGY-1 Phone: [phone] Date 02/21/2022 Time 11:50 AM

## 2022-02-21 NOTE — Assessment & Plan Note (Signed)
Patient's blood pressure today in the clinic was 140/74, noticeably down from her previous visit on August 23 where her pressure was 178/83.  She denies any blurry vision, dizziness, falls, chest pain.  Distal pulses are intact.  Blood pressure is slightly above goal of systolic below 683.  She also has not had a basic metabolic panel done in over a year.  Blood pressure at home has been in the 130s also elevated systolic.    Plan: - Continue amlodipine 10 mg - Advised patient to take 2 doses of her losartan at once, for 50 mg. - I already sent order with pharmacy note upon patient's next refill she should receive losartan 50 mg - We will update patient on lab results

## 2022-02-21 NOTE — Patient Instructions (Addendum)
Thank you so much for coming to the clinic today.  Today we talked about your blood pressure.  Looks like it is getting better, however we have made a slight change to your medication.  Remember, which do not take your losartan take 2 films of until you run out, the pharmacy already has orders for the 50 mg of it whenever you are count should be ready to go.  I will give you a phone call with your lab results come back.  If you have any questions, please call the clinic at [phone]  It was a pleasure being part of your care!  Best, Dr. Sanjuana Mae

## 2022-02-22 LAB — BMP8+ANION GAP
Anion Gap: 17 mmol/L (ref 10.0–18.0)
BUN/Creatinine Ratio: 15 (ref 12–28)
BUN: 11 mg/dL (ref 8–27)
CO2: 23 mmol/L (ref 20–29)
Calcium: 10.5 mg/dL — ABNORMAL HIGH (ref 8.7–10.3)
Chloride: 102 mmol/L (ref 96–106)
Creatinine, Ser: 0.71 mg/dL (ref 0.57–1.00)
Glucose: 106 mg/dL — ABNORMAL HIGH (ref 70–99)
Potassium: 4 mmol/L (ref 3.5–5.2)
Sodium: 142 mmol/L (ref 134–144)
eGFR: 94 mL/min/{1.73_m2} (ref >59)

## 2022-02-22 NOTE — Progress Notes (Signed)
Internal Medicine Clinic Attending  I saw and evaluated the patient.  I personally confirmed the key portions of the history and exam documented by the resident  and I reviewed pertinent patient test results.  The assessment, diagnosis, and plan were formulated together and I agree with the documentation in the resident's note.  

## 2022-02-23 ENCOUNTER — Telehealth: Payer: Self-pay | Admitting: Student

## 2022-02-23 NOTE — Telephone Encounter (Signed)
Discussed lab results with patient, assured results are unchanged from previous labs, and no intervention is required. Pt acknowledged understanding

## 2022-04-23 ENCOUNTER — Other Ambulatory Visit: Payer: Self-pay | Admitting: Student

## 2022-04-23 DIAGNOSIS — I10 Essential (primary) hypertension: Secondary | ICD-10-CM

## 2022-04-24 MED ORDER — LOSARTAN POTASSIUM 50 MG PO TABS
50.0000 mg | ORAL_TABLET | Freq: Every day | ORAL | 3 refills | Status: DC
Start: 2022-04-24 — End: 2023-12-30

## 2022-04-29 ENCOUNTER — Other Ambulatory Visit: Payer: Self-pay | Admitting: Student

## 2022-04-29 DIAGNOSIS — I10 Essential (primary) hypertension: Secondary | ICD-10-CM

## 2022-06-14 ENCOUNTER — Other Ambulatory Visit: Payer: Self-pay

## 2022-06-14 ENCOUNTER — Encounter: Payer: Self-pay | Admitting: Internal Medicine

## 2022-06-14 ENCOUNTER — Ambulatory Visit: Payer: BC Managed Care – PPO | Admitting: Internal Medicine

## 2022-06-14 VITALS — BP 129/74 | HR 92 | Temp 97.7°F | Ht 67.0 in | Wt 173.9 lb

## 2022-06-14 DIAGNOSIS — K635 Polyp of colon: Secondary | ICD-10-CM | POA: Diagnosis not present

## 2022-06-14 DIAGNOSIS — L709 Acne, unspecified: Secondary | ICD-10-CM | POA: Insufficient documentation

## 2022-06-14 DIAGNOSIS — R928 Other abnormal and inconclusive findings on diagnostic imaging of breast: Secondary | ICD-10-CM | POA: Diagnosis not present

## 2022-06-14 DIAGNOSIS — I10 Essential (primary) hypertension: Secondary | ICD-10-CM | POA: Diagnosis not present

## 2022-06-14 DIAGNOSIS — E2839 Other primary ovarian failure: Secondary | ICD-10-CM | POA: Diagnosis not present

## 2022-06-14 DIAGNOSIS — Z86010 Personal history of colonic polyps: Secondary | ICD-10-CM

## 2022-06-14 DIAGNOSIS — Z720 Tobacco use: Secondary | ICD-10-CM

## 2022-06-14 DIAGNOSIS — I700 Atherosclerosis of aorta: Secondary | ICD-10-CM

## 2022-06-14 DIAGNOSIS — F17210 Nicotine dependence, cigarettes, uncomplicated: Secondary | ICD-10-CM

## 2022-06-14 NOTE — Assessment & Plan Note (Signed)
Suspect the redness on her face is mild acne and compounded by her new skin regimen and mask use.  We discussed using disposable mask only when necessary.  Will go back to her previous skin care regimen and let me know if this clears up.

## 2022-06-14 NOTE — Assessment & Plan Note (Signed)
Spoke since 16 usually 0,5ppd.  We discussed lung cancer screening with low-dose CT risk of 1 in 4 of callback scans/abnormal findings willing to go forward with screening.

## 2022-06-14 NOTE — Progress Notes (Signed)
Established Patient Office Visit  Subjective   Patient ID: Laurie Contreras, female    DOB: June 14, 1955  Age: 67 y.o. MRN: [id number]  Chief Complaint  Patient presents with   Check-up Visit   Facial break-out   Laurie Contreras follows up today for routine care of hypertension and hyperlipidemia she is also is concerned about some redness on her face.  She has 2 areas on either cheek that have become little red and swollen she admits to picking at the area after it became red.  She has been wearing a mask recently and she is also started using a new facial soap that her daughter gave her for Christmas the soap is perfumed and black color she normally just uses a more regular soap on her face.  She reports she never had an issue with acne as a teenager.  Overall uses [employer] which has not changed and a facial moisturizer that also has not changed.  She has been adherent to her blood pressure medications and cholesterol medication she is taking a multivitamin daily has no issues with those medications.    Objective:     BP 129/74 (BP Location: Right Arm, Patient Position: Sitting, Cuff Size: Normal)   Pulse 92   Temp 97.7 F (36.5 C) (Oral)   Ht 5\' 7"  (1.702 m)   Wt 173 lb 14.4 oz (78.9 kg)   SpO2 96% Comment: RA  BMI 27.24 kg/m  BP Readings from Last 3 Encounters:  06/14/22 129/74  02/21/22 (!) 140/74  01/24/22 (!) 182/92   Wt Readings from Last 3 Encounters:  06/14/22 173 lb 14.4 oz (78.9 kg)  02/21/22 171 lb 6.4 oz (77.7 kg)  01/24/22 171 lb (77.6 kg)      Physical Exam Vitals and nursing note reviewed.  Constitutional:      Appearance: Normal appearance. She is obese.  Cardiovascular:     Rate and Rhythm: Normal rate and regular rhythm.     Pulses: Normal pulses.     Heart sounds: Normal heart sounds.  Pulmonary:     Effort: Pulmonary effort is normal.     Breath sounds: Normal breath sounds.  Skin:    Comments: Few areas of blackheads with 2 red areas with slight  induration suggestive of developing acne this is lateral to the nasolabial fold bilaterally.  Neurological:     Mental Status: She is alert.  Psychiatric:        Mood and Affect: Mood normal.      No results found for any visits on 06/14/22.  Last CBC Lab Results  Component Value Date   WBC 5.8 07/29/2020   HGB 12.1 07/29/2020   HCT 34.4 (L) 07/29/2020   MCV 81.7 07/29/2020   MCH 28.7 07/29/2020   RDW 15.8 (H) 07/29/2020   PLT 293 07/29/2020   Last lipids Lab Results  Component Value Date   CHOL 197 01/24/2022   HDL 33 (L) 01/24/2022   LDLCALC 134 (H) 01/24/2022   TRIG 168 (H) 01/24/2022   CHOLHDL 6.0 (H) 01/24/2022   Last hemoglobin A1c Lab Results  Component Value Date   HGBA1C 6.3 (A) 01/24/2022      The 10-year ASCVD risk score (Arnett DK, et al., 2019) is: 20.4%    Assessment & Plan:   Problem List Items Addressed This Visit       Cardiovascular and Mediastinum   Essential hypertension (Chronic)    Blood pressure is excellent today we will continue amlodipine and  losartan.      Aortic atherosclerosis (HCC)    Continue atorvastatin.        Musculoskeletal and Integument   Acne    Suspect the redness on her face is mild acne and compounded by her new skin regimen and mask use.  We discussed using disposable mask only when necessary.  Will go back to her previous skin care regimen and let me know if this clears up.        Other   Tobacco abuse (Chronic)    Spoke since 16 usually 0,5ppd.  We discussed lung cancer screening with low-dose CT risk of 1 in 4 of callback scans/abnormal findings willing to go forward with screening.      Relevant Orders   CT CHEST LUNG CA SCREEN LOW DOSE W/O CM   History of colon polyps   Relevant Orders   Ambulatory referral to Gastroenterology   Estrogen deficiency    Willing to get a DEXA scan      Relevant Orders   DG Bone Density   Abnormal mammogram of right breast - Primary    Her last screening  mammogram was abnormal was sent letter for call back but never completed she reports that because of calcifications her mammograms are always abnormal she is a little frustrated by this call back.  Given this I think I will send her for diagnostic mammogram since it is indicated from her last screening.      Relevant Orders   MM Digital Diagnostic Bilat    Return in about 6 months (around 12/13/2022).    Lucious Groves, DO

## 2022-06-14 NOTE — Assessment & Plan Note (Signed)
Willing to get a DEXA scan

## 2022-06-14 NOTE — Patient Instructions (Signed)
Go back to your previous facial skin care routine, let me know if the acne does not clear up.  I have ordered some screening tests to be completed before our next visit.

## 2022-06-14 NOTE — Assessment & Plan Note (Signed)
Her last screening mammogram was abnormal was sent letter for call back but never completed she reports that because of calcifications her mammograms are always abnormal she is a little frustrated by this call back.  Given this I think I will send her for diagnostic mammogram since it is indicated from her last screening.

## 2022-06-14 NOTE — Assessment & Plan Note (Signed)
Continue atorvastatin

## 2022-06-14 NOTE — Assessment & Plan Note (Signed)
Blood pressure is excellent today we will continue amlodipine and losartan.

## 2022-11-03 ENCOUNTER — Other Ambulatory Visit: Payer: Self-pay | Admitting: Student

## 2022-11-03 DIAGNOSIS — E782 Mixed hyperlipidemia: Secondary | ICD-10-CM

## 2022-11-21 DIAGNOSIS — D122 Benign neoplasm of ascending colon: Secondary | ICD-10-CM | POA: Diagnosis not present

## 2022-11-21 DIAGNOSIS — Z800 Family history of malignant neoplasm of digestive organs: Secondary | ICD-10-CM | POA: Diagnosis not present

## 2022-11-21 DIAGNOSIS — K635 Polyp of colon: Secondary | ICD-10-CM | POA: Diagnosis not present

## 2022-11-21 DIAGNOSIS — K649 Unspecified hemorrhoids: Secondary | ICD-10-CM | POA: Diagnosis not present

## 2022-11-21 DIAGNOSIS — Z1211 Encounter for screening for malignant neoplasm of colon: Secondary | ICD-10-CM | POA: Diagnosis not present

## 2023-12-03 ENCOUNTER — Telehealth: Payer: Self-pay | Admitting: Internal Medicine

## 2023-12-03 ENCOUNTER — Encounter: Payer: Self-pay | Admitting: *Deleted

## 2023-12-03 DIAGNOSIS — E782 Mixed hyperlipidemia: Secondary | ICD-10-CM

## 2023-12-03 MED ORDER — ATORVASTATIN CALCIUM 40 MG PO TABS: 40.0000 mg | ORAL_TABLET | Freq: Every day | ORAL | 0 refills | Status: DC

## 2023-12-03 NOTE — Telephone Encounter (Signed)
 Copied from CRM [id number]. Topic: Clinical - Medication Refill >> Dec 03, 2023  3:00 PM Fredrica W wrote: Medication:  atorvastatin  (LIPITOR) 40 MG tablet - pharmacy called to request states have been attempted to refill since 23rd. Have not received response. Are going to close out request if no response received via escribe. Thank You    Has the patient contacted their pharmacy? Pharmacy called (Agent: If no, request that the patient contact the pharmacy for the refill. If patient does not wish to contact the pharmacy document the reason why and proceed with request.) (Agent: If yes, when and what did the pharmacy advise?)  This is the patient's preferred pharmacy:  [facility] STORE #[id number] [place] - [address] AT [facility] [address] [place] [address] Phone: [phone] Fax: [phone]  Is this the correct pharmacy for this prescription? Yes If no, delete pharmacy and type the correct one.   Has the prescription been filled recently? N/A  Is the patient out of the medication? N/A  Has the patient been seen for an appointment in the last year OR does the patient have an upcoming appointment? Yes  Can we respond through MyChart? No  Agent: Please be advised that Rx refills may take up to 3 business days. We ask that you follow-up with your pharmacy.

## 2023-12-03 NOTE — Telephone Encounter (Signed)
 LOV - 06/2022. Pt was called to schedule an appt - no answer; left message for pt to call the office to schedule an appt.

## 2023-12-04 ENCOUNTER — Encounter: Payer: Self-pay | Admitting: *Deleted

## 2023-12-04 NOTE — Telephone Encounter (Signed)
 Pt was called again to schedule an appt - no answer; I will send pt a My Chart message.

## 2023-12-30 ENCOUNTER — Encounter: Payer: Self-pay | Admitting: Internal Medicine

## 2023-12-30 ENCOUNTER — Ambulatory Visit: Payer: Self-pay | Admitting: Internal Medicine

## 2023-12-30 VITALS — BP 168/92 | HR 72 | Temp 98.2°F | Ht 67.0 in | Wt 167.2 lb

## 2023-12-30 DIAGNOSIS — R7303 Prediabetes: Secondary | ICD-10-CM | POA: Diagnosis not present

## 2023-12-30 DIAGNOSIS — E782 Mixed hyperlipidemia: Secondary | ICD-10-CM

## 2023-12-30 DIAGNOSIS — E785 Hyperlipidemia, unspecified: Secondary | ICD-10-CM

## 2023-12-30 DIAGNOSIS — E7800 Pure hypercholesterolemia, unspecified: Secondary | ICD-10-CM | POA: Diagnosis not present

## 2023-12-30 DIAGNOSIS — R928 Other abnormal and inconclusive findings on diagnostic imaging of breast: Secondary | ICD-10-CM | POA: Diagnosis not present

## 2023-12-30 DIAGNOSIS — M1712 Unilateral primary osteoarthritis, left knee: Secondary | ICD-10-CM

## 2023-12-30 DIAGNOSIS — I10 Essential (primary) hypertension: Secondary | ICD-10-CM

## 2023-12-30 DIAGNOSIS — Z1231 Encounter for screening mammogram for malignant neoplasm of breast: Secondary | ICD-10-CM

## 2023-12-30 MED ORDER — AMLODIPINE BESY-BENAZEPRIL HCL 10-20 MG PO CAPS: 1.0000 | ORAL_CAPSULE | Freq: Every day | ORAL | 3 refills | Status: AC

## 2023-12-30 MED ORDER — ATORVASTATIN CALCIUM 40 MG PO TABS: 40.0000 mg | ORAL_TABLET | Freq: Every day | ORAL | 3 refills | Status: AC

## 2023-12-30 MED ORDER — VARENICLINE TARTRATE (STARTER) 0.5 MG X 11 & 1 MG X 42 PO TBPK: ORAL_TABLET | ORAL | 0 refills | Status: AC

## 2023-12-30 NOTE — Progress Notes (Unsigned)
   Established Patient Office Visit  Subjective   Patient ID: Laurie Contreras, female    DOB: 12/03/1955  Age: 68 y.o. MRN: [id number]  No chief complaint on file.  Left knee bothering her, worse with walking     Objective:     There were no vitals taken for this visit. {Vitals History (Optional):23777}  Physical Exam   No results found for any visits on 12/30/23.  {Labs (Optional):23779}  The ASCVD Risk score (Arnett DK, et al., 2019) failed to calculate for the following reasons:   The systolic blood pressure is missing    Assessment & Plan:   Problem List Items Addressed This Visit   None   No follow-ups on file.    [place] JAYSON Eastern, DO

## 2023-12-31 LAB — COMPREHENSIVE METABOLIC PANEL WITH GFR
ALT: 20 IU/L (ref 0–32)
AST: 27 IU/L (ref 0–40)
Albumin: 4.4 g/dL (ref 3.9–4.9)
Alkaline Phosphatase: 119 IU/L (ref 44–121)
BUN/Creatinine Ratio: 12 (ref 12–28)
BUN: 10 mg/dL (ref 8–27)
Bilirubin Total: 0.6 mg/dL (ref 0.0–1.2)
CO2: 22 mmol/L (ref 20–29)
Calcium: 10.6 mg/dL — ABNORMAL HIGH (ref 8.7–10.3)
Chloride: 106 mmol/L (ref 96–106)
Creatinine, Ser: 0.82 mg/dL (ref 0.57–1.00)
Globulin, Total: 3.2 g/dL (ref 1.5–4.5)
Glucose: 103 mg/dL — ABNORMAL HIGH (ref 70–99)
Potassium: 3.6 mmol/L (ref 3.5–5.2)
Sodium: 141 mmol/L (ref 134–144)
Total Protein: 7.6 g/dL (ref 6.0–8.5)
eGFR: 78 mL/min/1.73 (ref >59)

## 2023-12-31 LAB — LIPID PANEL
Chol/HDL Ratio: 2.8 ratio (ref 0.0–4.4)
Cholesterol, Total: 117 mg/dL (ref 100–199)
HDL: 42 mg/dL (ref >39)
LDL Chol Calc (NIH): 61 mg/dL (ref 0–99)
Triglycerides: 64 mg/dL (ref 0–149)
VLDL Cholesterol Cal: 14 mg/dL (ref 5–40)

## 2023-12-31 LAB — CBC WITH DIFFERENTIAL/PLATELET
Basophils Absolute: 0 x10E3/uL (ref 0.0–0.2)
Basos: 1 %
EOS (ABSOLUTE): 0 x10E3/uL (ref 0.0–0.4)
Eos: 1 %
Hematocrit: 37 % (ref 34.0–46.6)
Hemoglobin: 12.4 g/dL (ref 11.1–15.9)
Immature Grans (Abs): 0 x10E3/uL (ref 0.0–0.1)
Immature Granulocytes: 0 %
Lymphocytes Absolute: 2.5 x10E3/uL (ref 0.7–3.1)
Lymphs: 43 %
MCH: 28.9 pg (ref 26.6–33.0)
MCHC: 33.5 g/dL (ref 31.5–35.7)
MCV: 86 fL (ref 79–97)
Monocytes Absolute: 0.4 x10E3/uL (ref 0.1–0.9)
Monocytes: 7 %
Neutrophils Absolute: 2.8 x10E3/uL (ref 1.4–7.0)
Neutrophils: 48 %
Platelets: 255 x10E3/uL (ref 150–450)
RBC: 4.29 x10E6/uL (ref 3.77–5.28)
RDW: 14.8 % (ref 11.7–15.4)
WBC: 5.8 x10E3/uL (ref 3.4–10.8)

## 2023-12-31 LAB — HEMOGLOBIN A1C
Est. average glucose Bld gHb Est-mCnc: 131 mg/dL
Hgb A1c MFr Bld: 6.2 % — ABNORMAL HIGH (ref 4.8–5.6)

## 2023-12-31 MED ORDER — TRIAMCINOLONE ACETONIDE 40 MG/ML IJ SUSP
40.0000 mg | Freq: Once | INTRAMUSCULAR | Status: AC
  Administered 2023-12-30: 40 mg via INTRA_ARTICULAR

## 2023-12-31 MED ORDER — LIDOCAINE HCL (PF) 1 % IJ SOLN
2.0000 mL | Freq: Once | INTRAMUSCULAR | Status: AC
Start: 2023-12-31 — End: 2023-12-30
  Administered 2023-12-30: 2 mL

## 2023-12-31 NOTE — Assessment & Plan Note (Signed)
 On atorvastatin  40 we will plan to continue to check lipid panel.

## 2023-12-31 NOTE — Assessment & Plan Note (Signed)
 Given last abnormal mammogram without diagnostic repeat will go ahead and add as diagnostic mammogram.

## 2023-12-31 NOTE — Assessment & Plan Note (Signed)
 Uncontrolled we will add benazepril  check BMP  Amlodipine  benazepril  10-20 mg daily

## 2023-12-31 NOTE — Assessment & Plan Note (Signed)
 Has had benefit from the injection in the past discussed risk and benefit and agreeable to proceed.

## 2024-02-26 ENCOUNTER — Emergency Department (HOSPITAL_COMMUNITY)

## 2024-02-26 ENCOUNTER — Other Ambulatory Visit: Payer: Self-pay

## 2024-02-26 ENCOUNTER — Encounter (HOSPITAL_COMMUNITY): Payer: Self-pay | Admitting: Student

## 2024-02-26 ENCOUNTER — Observation Stay (HOSPITAL_COMMUNITY)
Admission: EM | Admit: 2024-02-26 | Discharge: 2024-02-27 | Disposition: A | Discharge status: Home / Self Care | Attending: Infectious Diseases | Admitting: Infectious Diseases

## 2024-02-26 DIAGNOSIS — Z7982 Long term (current) use of aspirin: Secondary | ICD-10-CM | POA: Insufficient documentation

## 2024-02-26 DIAGNOSIS — R29818 Other symptoms and signs involving the nervous system: Secondary | ICD-10-CM

## 2024-02-26 DIAGNOSIS — R7989 Other specified abnormal findings of blood chemistry: Secondary | ICD-10-CM

## 2024-02-26 DIAGNOSIS — J439 Emphysema, unspecified: Secondary | ICD-10-CM | POA: Insufficient documentation

## 2024-02-26 DIAGNOSIS — Z7902 Long term (current) use of antithrombotics/antiplatelets: Secondary | ICD-10-CM | POA: Diagnosis not present

## 2024-02-26 DIAGNOSIS — Z79899 Other long term (current) drug therapy: Secondary | ICD-10-CM | POA: Diagnosis not present

## 2024-02-26 DIAGNOSIS — Z8673 Personal history of transient ischemic attack (TIA), and cerebral infarction without residual deficits: Secondary | ICD-10-CM | POA: Diagnosis not present

## 2024-02-26 DIAGNOSIS — E785 Hyperlipidemia, unspecified: Secondary | ICD-10-CM | POA: Insufficient documentation

## 2024-02-26 DIAGNOSIS — I5020 Unspecified systolic (congestive) heart failure: Secondary | ICD-10-CM | POA: Diagnosis not present

## 2024-02-26 DIAGNOSIS — I10 Essential (primary) hypertension: Secondary | ICD-10-CM | POA: Diagnosis not present

## 2024-02-26 DIAGNOSIS — I700 Atherosclerosis of aorta: Secondary | ICD-10-CM | POA: Diagnosis not present

## 2024-02-26 DIAGNOSIS — I639 Cerebral infarction, unspecified: Principal | ICD-10-CM

## 2024-02-26 DIAGNOSIS — I110 Hypertensive heart disease with heart failure: Secondary | ICD-10-CM | POA: Insufficient documentation

## 2024-02-26 DIAGNOSIS — K219 Gastro-esophageal reflux disease without esophagitis: Secondary | ICD-10-CM | POA: Insufficient documentation

## 2024-02-26 DIAGNOSIS — R9082 White matter disease, unspecified: Secondary | ICD-10-CM | POA: Diagnosis not present

## 2024-02-26 DIAGNOSIS — I6389 Other cerebral infarction: Principal | ICD-10-CM | POA: Insufficient documentation

## 2024-02-26 DIAGNOSIS — I6523 Occlusion and stenosis of bilateral carotid arteries: Secondary | ICD-10-CM | POA: Diagnosis not present

## 2024-02-26 DIAGNOSIS — E876 Hypokalemia: Secondary | ICD-10-CM | POA: Diagnosis not present

## 2024-02-26 DIAGNOSIS — F17210 Nicotine dependence, cigarettes, uncomplicated: Secondary | ICD-10-CM | POA: Insufficient documentation

## 2024-02-26 DIAGNOSIS — E7800 Pure hypercholesterolemia, unspecified: Secondary | ICD-10-CM

## 2024-02-26 DIAGNOSIS — I708 Atherosclerosis of other arteries: Secondary | ICD-10-CM | POA: Diagnosis not present

## 2024-02-26 DIAGNOSIS — G43109 Migraine with aura, not intractable, without status migrainosus: Secondary | ICD-10-CM

## 2024-02-26 DIAGNOSIS — D649 Anemia, unspecified: Secondary | ICD-10-CM | POA: Insufficient documentation

## 2024-02-26 DIAGNOSIS — E041 Nontoxic single thyroid nodule: Secondary | ICD-10-CM | POA: Diagnosis not present

## 2024-02-26 DIAGNOSIS — R519 Headache, unspecified: Secondary | ICD-10-CM | POA: Diagnosis not present

## 2024-02-26 DIAGNOSIS — R4701 Aphasia: Secondary | ICD-10-CM | POA: Diagnosis not present

## 2024-02-26 DIAGNOSIS — I6782 Cerebral ischemia: Secondary | ICD-10-CM | POA: Diagnosis not present

## 2024-02-26 DIAGNOSIS — Z720 Tobacco use: Secondary | ICD-10-CM | POA: Diagnosis present

## 2024-02-26 LAB — LIPID PANEL
Cholesterol: 107 mg/dL (ref 0–200)
HDL: 34 mg/dL — ABNORMAL LOW (ref >40)
LDL Cholesterol: 55 mg/dL (ref 0–99)
Total CHOL/HDL Ratio: 3.1 ratio
Triglycerides: 88 mg/dL (ref <150)
VLDL: 18 mg/dL (ref 0–40)

## 2024-02-26 LAB — RAPID URINE DRUG SCREEN, HOSP PERFORMED
Amphetamines: NOT DETECTED
Barbiturates: NOT DETECTED
Benzodiazepines: NOT DETECTED
Cocaine: NOT DETECTED
Opiates: NOT DETECTED
Tetrahydrocannabinol: NOT DETECTED

## 2024-02-26 LAB — COMPREHENSIVE METABOLIC PANEL WITH GFR
ALT: 18 U/L (ref 0–44)
AST: 26 U/L (ref 15–41)
Albumin: 4.1 g/dL (ref 3.5–5.0)
Alkaline Phosphatase: 98 U/L (ref 38–126)
Anion gap: 10 (ref 5–15)
BUN: 10 mg/dL (ref 8–23)
CO2: 25 mmol/L (ref 22–32)
Calcium: 10.5 mg/dL — ABNORMAL HIGH (ref 8.9–10.3)
Chloride: 104 mmol/L (ref 98–111)
Creatinine, Ser: 1.04 mg/dL — ABNORMAL HIGH (ref 0.44–1.00)
GFR, Estimated: 59 mL/min — ABNORMAL LOW (ref >60)
Glucose, Bld: 105 mg/dL — ABNORMAL HIGH (ref 70–99)
Potassium: 3.4 mmol/L — ABNORMAL LOW (ref 3.5–5.1)
Sodium: 139 mmol/L (ref 135–145)
Total Bilirubin: 0.9 mg/dL (ref 0.0–1.2)
Total Protein: 8.3 g/dL — ABNORMAL HIGH (ref 6.5–8.1)

## 2024-02-26 LAB — DIFFERENTIAL
Abs Immature Granulocytes: 0.02 K/uL (ref 0.00–0.07)
Basophils Absolute: 0 K/uL (ref 0.0–0.1)
Basophils Relative: 1 %
Eosinophils Absolute: 0 K/uL (ref 0.0–0.5)
Eosinophils Relative: 1 %
Immature Granulocytes: 0 %
Lymphocytes Relative: 47 %
Lymphs Abs: 2.8 K/uL (ref 0.7–4.0)
Monocytes Absolute: 0.4 K/uL (ref 0.1–1.0)
Monocytes Relative: 8 %
Neutro Abs: 2.4 K/uL (ref 1.7–7.7)
Neutrophils Relative %: 43 %

## 2024-02-26 LAB — I-STAT CHEM 8, ED
BUN: 10 mg/dL (ref 8–23)
Calcium, Ion: 1.3 mmol/L (ref 1.15–1.40)
Chloride: 104 mmol/L (ref 98–111)
Creatinine, Ser: 1.2 mg/dL — ABNORMAL HIGH (ref 0.44–1.00)
Glucose, Bld: 102 mg/dL — ABNORMAL HIGH (ref 70–99)
HCT: 36 % (ref 36.0–46.0)
Hemoglobin: 12.2 g/dL (ref 12.0–15.0)
Potassium: 3.5 mmol/L (ref 3.5–5.1)
Sodium: 143 mmol/L (ref 135–145)
TCO2: 26 mmol/L (ref 22–32)

## 2024-02-26 LAB — CBC
HCT: 35.5 % — ABNORMAL LOW (ref 36.0–46.0)
Hemoglobin: 12.2 g/dL (ref 12.0–15.0)
MCH: 29 pg (ref 26.0–34.0)
MCHC: 34.4 g/dL (ref 30.0–36.0)
MCV: 84.5 fL (ref 80.0–100.0)
Platelets: 240 K/uL (ref 150–400)
RBC: 4.2 MIL/uL (ref 3.87–5.11)
RDW: 15.4 % (ref 11.5–15.5)
WBC: 5.7 K/uL (ref 4.0–10.5)
nRBC: 0 % (ref 0.0–0.2)

## 2024-02-26 LAB — PROTIME-INR
INR: 1 (ref 0.8–1.2)
Prothrombin Time: 13.6 s (ref 11.4–15.2)

## 2024-02-26 LAB — ETHANOL: Alcohol, Ethyl (B): <15 mg/dL (ref <15)

## 2024-02-26 LAB — CBG MONITORING, ED: Glucose-Capillary: 105 mg/dL — ABNORMAL HIGH (ref 70–99)

## 2024-02-26 LAB — APTT: aPTT: 38 s — ABNORMAL HIGH (ref 24–36)

## 2024-02-26 MED ORDER — CLOPIDOGREL BISULFATE 75 MG PO TABS
75.0000 mg | ORAL_TABLET | Freq: Every day | ORAL | Status: DC
  Administered 2024-02-26 – 2024-02-27 (×2): 75 mg via ORAL
  Filled 2024-02-26 (×2): qty 1

## 2024-02-26 MED ORDER — ASPIRIN 81 MG PO TBEC
81.0000 mg | DELAYED_RELEASE_TABLET | Freq: Every day | ORAL | Status: DC
  Administered 2024-02-26 – 2024-02-27 (×2): 81 mg via ORAL
  Filled 2024-02-26 (×2): qty 1

## 2024-02-26 MED ORDER — NICOTINE 14 MG/24HR TD PT24
14.0000 mg | MEDICATED_PATCH | Freq: Every day | TRANSDERMAL | Status: DC
  Administered 2024-02-26 – 2024-02-27 (×2): 14 mg via TRANSDERMAL
  Filled 2024-02-26 (×2): qty 1

## 2024-02-26 MED ORDER — IOHEXOL 350 MG/ML SOLN
75.0000 mL | Freq: Once | INTRAVENOUS | Status: AC | PRN
  Administered 2024-02-26: 75 mL via INTRAVENOUS

## 2024-02-26 MED ORDER — ATORVASTATIN CALCIUM 40 MG PO TABS
40.0000 mg | ORAL_TABLET | Freq: Every day | ORAL | Status: DC
  Administered 2024-02-26: 40 mg via ORAL
  Filled 2024-02-26: qty 1

## 2024-02-26 MED ORDER — PANTOPRAZOLE SODIUM 20 MG PO TBEC
40.0000 mg | DELAYED_RELEASE_TABLET | Freq: Every day | ORAL | Status: DC
  Administered 2024-02-27: 40 mg via ORAL
  Filled 2024-02-26: qty 2

## 2024-02-26 MED ORDER — ACETAMINOPHEN 325 MG PO TABS: 650.0000 mg | ORAL_TABLET | Freq: Four times a day (QID) | ORAL | Status: DC | PRN

## 2024-02-26 MED ORDER — ENOXAPARIN SODIUM 40 MG/0.4ML IJ SOSY: 40.0000 mg | PREFILLED_SYRINGE | INTRAMUSCULAR | Status: DC

## 2024-02-26 MED ORDER — NICOTINE 14 MG/24HR TD PT24: 14.0000 mg | MEDICATED_PATCH | Freq: Every day | TRANSDERMAL | Status: DC

## 2024-02-26 MED ORDER — ENOXAPARIN SODIUM 40 MG/0.4ML IJ SOSY
40.0000 mg | PREFILLED_SYRINGE | INTRAMUSCULAR | Status: DC
Start: 2024-02-27 — End: 2024-02-27
  Administered 2024-02-27: 40 mg via SUBCUTANEOUS
  Filled 2024-02-26: qty 0.4

## 2024-02-26 MED ORDER — ATORVASTATIN CALCIUM 40 MG PO TABS: 80.0000 mg | ORAL_TABLET | Freq: Every day | ORAL | Status: DC

## 2024-02-26 MED ORDER — POTASSIUM CHLORIDE CRYS ER 20 MEQ PO TBCR
40.0000 meq | EXTENDED_RELEASE_TABLET | Freq: Once | ORAL | Status: AC
  Administered 2024-02-26: 40 meq via ORAL
  Filled 2024-02-26: qty 2

## 2024-02-26 NOTE — ED Provider Notes (Signed)
 [place] EMERGENCY DEPARTMENT AT [facility] Provider Note   CSN: [id number] Arrival date & time: 02/26/24  1647  An emergency department physician performed an initial assessment on this suspected stroke patient at 1649.  Patient presents with: Code Stroke   KYLENE ZAMARRON is a 68 y.o. female.   68 yo F with a cc of aphasia arrived as a code stroke.         Prior to Admission medications   Medication Sig Start Date End Date Taking? Authorizing Provider  amLODipine -benazepril  (LOTREL) 10-20 MG capsule Take 1 capsule by mouth daily. 12/30/23 12/29/24  Rosan Dayton BROCKS, DO  atorvastatin  (LIPITOR) 40 MG tablet Take 1 tablet (40 mg total) by mouth daily. 12/30/23   Rosan Dayton BROCKS, DO  diphenhydrAMINE  (BENADRYL ) 25 mg capsule Take 2 capsules (50 mg total) by mouth every 6 (six) hours as needed for allergies. Patient not taking: Reported on 12/30/2023 10/07/19   Aslam, Sadia, MD  Multiple Vitamin (MULTIVITAMIN WITH MINERALS) TABS tablet Take 1 tablet by mouth daily. Patient not taking: Reported on 12/30/2023    [provider]  omeprazole  (PRILOSEC OTC) 20 MG tablet Take 20 mg by mouth daily as needed. Patient not taking: Reported on 12/30/2023    [provider]  Varenicline  Tartrate, Starter, (CHANTIX  STARTING MONTH PAK) 0.5 MG X 11 & 1 MG X 42 TBPK Take 0.5 mg by mouth daily for 3 days, THEN 0.5 mg 2 (two) times daily for 4 days, THEN 1 mg 2 (two) times daily. 12/30/23 03/06/24  Rosan Dayton BROCKS, DO    Allergies: Naprosyn  [naproxen ]    Review of Systems  Updated Vital Signs BP 129/62   Pulse 72   Resp 20   SpO2 98%   Physical Exam Vitals and nursing note reviewed.  Constitutional:      General: She is not in acute distress.    Appearance: She is well-developed. She is not diaphoretic.  HENT:     Head: Normocephalic and atraumatic.  Eyes:     Pupils: Pupils are equal, round, and reactive to light.  Cardiovascular:     Rate and Rhythm: Normal rate  and regular rhythm.     Heart sounds: No murmur heard.    No friction rub. No gallop.  Pulmonary:     Effort: Pulmonary effort is normal.     Breath sounds: No wheezing or rales.  Abdominal:     General: There is no distension.     Palpations: Abdomen is soft.     Tenderness: There is no abdominal tenderness.  Musculoskeletal:        General: No tenderness.     Cervical back: Normal range of motion and neck supple.  Skin:    General: Skin is warm and dry.  Neurological:     Mental Status: She is alert and oriented to person, place, and time.  Psychiatric:        Behavior: Behavior normal.     (all labs ordered are listed, but only abnormal results are displayed) Labs Reviewed  APTT - Abnormal; Notable for the following components:      Result Value   aPTT 38 (*)    All other components within normal limits  CBC - Abnormal; Notable for the following components:   HCT 35.5 (*)    All other components within normal limits  COMPREHENSIVE METABOLIC PANEL WITH GFR - Abnormal; Notable for the following components:   Potassium 3.4 (*)    Glucose, Bld  105 (*)    Creatinine, Ser 1.04 (*)    Calcium  10.5 (*)    Total Protein 8.3 (*)    GFR, Estimated 59 (*)    All other components within normal limits  I-STAT CHEM 8, ED - Abnormal; Notable for the following components:   Creatinine, Ser 1.20 (*)    Glucose, Bld 102 (*)    All other components within normal limits  CBG MONITORING, ED - Abnormal; Notable for the following components:   Glucose-Capillary 105 (*)    All other components within normal limits  ETHANOL  PROTIME-INR  DIFFERENTIAL  RAPID URINE DRUG SCREEN, HOSP PERFORMED  LIPID PANEL  HEMOGLOBIN A1C    EKG: None  Radiology: MR BRAIN WO CONTRAST Result Date: 02/26/2024 CLINICAL DATA:  Follow-up examination for acute stroke. EXAM: MRI HEAD WITHOUT CONTRAST TECHNIQUE: Multiplanar, multiecho pulse sequences of the brain and surrounding structures were obtained  without intravenous contrast. COMPARISON:  Prior CTs from earlier the same day. FINDINGS: Brain: Cerebral volume within normal limits. Patchy and confluent T2/FLAIR hyperintensity involving the periventricular deep white matter both cerebral hemispheres as well as the pons, consistent with chronic small vessel ischemic disease, moderate in nature. Few scattered superimposed remote lacunar infarcts noted about the deep gray nuclei and pons. Small focus of serpiginous diffusion signal abnormality seen involving the subcortical left frontal lobe, likely reflecting a small acute ischemic infarct (series 2, images 34, 32). Subtle associated signal loss on ADC map. No associated hemorrhage or mass effect. No other evidence for acute or subacute ischemia. Gray-white matter differentiation otherwise maintained. No acute or chronic intracranial blood products. No mass lesion, midline shift or mass effect. No hydrocephalus or extra-axial fluid collection. Pituitary gland within normal limits. Vascular: Major intracranial vascular flow voids are maintained. Skull and upper cervical spine: Craniocervical junction within normal limits. Bone marrow signal intensity overall within normal limits. No scalp soft tissue abnormality. Sinuses/Orbits: Globes and orbital soft tissues within normal limits. Mild scattered mucosal thickening present about the ethmoidal air cells. Paranasal sinuses are otherwise largely clear. No significant mastoid effusion. Other: None. IMPRESSION: 1. Small acute ischemic nonhemorrhagic subcortical left frontal lobe infarct. 2. Underlying moderate chronic microvascular ischemic disease with a few scattered remote lacunar infarcts about the deep gray nuclei and pons. Electronically Signed   By: Morene Hoard M.D.   On: 02/26/2024 19:39   CT ANGIO HEAD NECK W WO CM (CODE STROKE) Result Date: 02/26/2024 CLINICAL DATA:  Neuro deficit, acute, stroke suspected. EXAM: CT ANGIOGRAPHY HEAD AND NECK WITH  AND WITHOUT CONTRAST TECHNIQUE: Multidetector CT imaging of the head and neck was performed using the standard protocol during bolus administration of intravenous contrast. Multiplanar CT image reconstructions and MIPs were obtained to evaluate the vascular anatomy. Carotid stenosis measurements (when applicable) are obtained utilizing NASCET criteria, using the distal internal carotid diameter as the denominator. RADIATION DOSE REDUCTION: This exam was performed according to the departmental dose-optimization program which includes automated exposure control, adjustment of the mA and/or kV according to patient size and/or use of iterative reconstruction technique. CONTRAST:  75mL OMNIPAQUE  IOHEXOL  350 MG/ML SOLN COMPARISON:  None Available. FINDINGS: CTA NECK FINDINGS Aortic arch: Normal variant aortic arch branching pattern with common origin of the brachiocephalic and left common carotid arteries. Mild atherosclerosis without a significant stenosis of the arch vessel origins. Soft plaque in the proximal left subclavian artery resulting in less than 50% stenosis. Right carotid system: Patent with predominantly calcified plaque at the carotid bifurcation. No evidence of  a significant stenosis or dissection. Left carotid system: Patent with minimal calcified plaque at the carotid bifurcation. No evidence of a significant stenosis or dissection. Vertebral arteries: Patent with the right being mildly dominant. Moderate stenosis versus artifact from streak and motion at the right vertebral origin. No evidence of a significant stenosis or dissection elsewhere. Skeleton: Mild cervical disc degeneration. Advanced right facet arthrosis at C2-3 and C3-4. Other neck: No evidence of cervical lymphadenopathy or mass. 8 mm left thyroid nodule for which no follow-up imaging is recommended. Upper chest: Centrilobular emphysema. Review of the MIP images confirms the above findings CTA HEAD FINDINGS Anterior circulation: The  internal carotid arteries are patent from skull base to carotid termini with mild atherosclerotic irregularity but no significant stenosis. ACAs and MCAs are patent without evidence of a proximal branch occlusion or significant proximal stenosis. No aneurysm is identified. Posterior circulation: The intracranial vertebral arteries are patent to the basilar. Patent right PICA, left AICA, and bilateral SCA origins are visualized. The basilar artery is patent with irregularity in up to mild narrowing proximally. There is a patent right posterior communicating artery. Both PCAs are patent without evidence of a significant proximal stenosis. No aneurysm is identified. Venous sinuses: Patent. Anatomic variants: None. Review of the MIP images confirms the above findings These results were communicated to Dr. Michaela at 5:07 pm on 02/26/2024 by text page via the [facility] messaging system. IMPRESSION: 1. No large vessel occlusion or flow limiting proximal intracranial arterial stenosis. 2. Moderate stenosis versus artifact at the right vertebral artery origin. 3. Cervical carotid atherosclerosis without stenosis. 4. Aortic Atherosclerosis (ICD10-I70.0) and Emphysema (ICD10-J43.9). Electronically Signed   By: Dasie Hamburg M.D.   On: 02/26/2024 17:15   CT HEAD CODE STROKE WO CONTRAST Result Date: 02/26/2024 EXAM: CT HEAD WITHOUT CONTRAST 02/26/2024 04:56:00 PM TECHNIQUE: CT of the head was performed without the administration of intravenous contrast. Automated exposure control, iterative reconstruction, and/or weight based adjustment of the mA/kV was utilized to reduce the radiation dose to as low as reasonably achievable. COMPARISON: CT head without contrast 08/24/2014. CLINICAL HISTORY: Neuro deficit, acute, stroke suspected. Code Stroke. FINDINGS: BRAIN AND VENTRICLES: No acute hemorrhage. No evidence of acute infarct. No hydrocephalus. No extra-axial collection. No mass effect or midline shift. Progressive  periventricular white matter disease is present bilaterally. ORBITS: No acute abnormality. SINUSES: No acute abnormality. SOFT TISSUES AND SKULL: No acute soft tissue abnormality. No skull fracture. [place] stroke program early CT (ASPECTS) score: Ganglionic (caudate, internal capsule, lentiform nucleus, insula, M1-M3): 7 Supraganglionic (M4-M6): 3 Total: 10 The pertinent results were texted to Dr. Michaela via the [facility] system at 5:00 PM. IMPRESSION: 1. No acute intracranial abnormality. 2. Progressive periventricular white matter disease bilaterally. Electronically signed by: Lonni Necessary MD 02/26/2024 05:01 PM EDT RP Workstation: [id number]     Procedures   Medications Ordered in the ED  aspirin  EC tablet 81 mg (has no administration in time range)  clopidogrel  (PLAVIX ) tablet 75 mg (has no administration in time range)  iohexol  (OMNIPAQUE ) 350 MG/ML injection 75 mL (75 mLs Intravenous Contrast Given 02/26/24 1700)                                    Medical Decision Making Amount and/or Complexity of Data Reviewed Labs: ordered. Radiology: ordered.   68 yo F with a cc of aphasia.  Arrived as a code stroke.  Airway cleared at the bridge  taken urgently back to CT.    Patient seen on arrival by Dr. Michaela, neurology thought symptoms most consistent with complicated migraine.  Recommends MRI.  MRI is concerning for acute stroke.  I discussed this with Dr. Vanessa, recommends medical admission for stroke workup.  The patients results and plan were reviewed and discussed.   Any x-rays performed were independently reviewed by myself.   Differential diagnosis were considered with the presenting HPI.  Medications  aspirin  EC tablet 81 mg (has no administration in time range)  clopidogrel  (PLAVIX ) tablet 75 mg (has no administration in time range)  iohexol  (OMNIPAQUE ) 350 MG/ML injection 75 mL (75 mLs Intravenous Contrast Given 02/26/24 1700)    Vitals:   02/26/24  1730 02/26/24 1745 02/26/24 1800 02/26/24 1930  BP: (!) 165/76 (!) 158/72 (!) 144/81 129/62  Pulse: 79 77 77 72  Resp: 15 18 16 20   SpO2: 90% 97% 98% 98%    Final diagnoses:  Acute ischemic stroke [facility])    Admission/ observation were discussed with the admitting physician, patient and/or family and they are comfortable with the plan.       Final diagnoses:  Acute ischemic stroke [facility])    ED Discharge Orders     None          Emil Share, DO 02/26/24 1948

## 2024-02-26 NOTE — H&P (Incomplete)
 Date: 02/26/2024               Patient Name:  Laurie Contreras MRN: [id number]  DOB: [birth year] Age / Sex: 68 y.o., female   PCP: Rosan Dayton BROCKS, DO         Medical Service: Internal Medicine Teaching Service         Attending Physician: Dr. Reyes Fenton      First Contact: Doyal Miyamoto, MD    Second Contact: Dr. Damien Lease, DO         Pager Information: First Contact Pager: [phone]   Second Contact Pager: [phone]   SUBJECTIVE   Chief Complaint: Speech difficulty   History of Present Illness: Laurie Contreras is a 68 y.o. female with a history of HTN and migraines with aura who presents to [facility] ED with speech finding difficulty and headache.   Patient resting comfortably in bed with daughter at bedside upon entry for interview. She states that she was in her normal state of health, and went to lunch around 1500 today with her co-worker. She states that her head felt funny and she was trying to tell her coworker that she needed to sit down, but the next thing she knew she had fallen on the floor. She states that she felt like she couldn't get her words out of her mouth and began to have a terrible headache 10/10 pain. She denies hitting her head, and reports that she fell on her left side, and is not experiencing any pain in that area now. EMS arrived at the scene to bring her to [facility] ED. Patient and daughter report that her speech finding difficulty improved and she returned to her baseline while in the ED before we arrived. She reports that her headache has improved now to 6/10 pain and she feels at baseline with her speech.  She reports a past history of migraines with aura, photophobia and phonophobia. She reports that if she takes an Excedrin Migraine at the start, then they typically go away, but she often has to leave work and rest in a dark room at home.   ROS: Denies dizziness, fever, chills, runny nose, sore throat, vision changes, hearing changes, chest pain, shortness of  breath, difficulty breathing, nausea, vomiting, abdominal pain. Denies increased urinary frequency, pain with urination, constipation or diarrhea. Recent fall on her left side without LOC or hitting her head; reports no pain currently. Endorses 6/10 headache.    ED Course: Labs: K 3.4, Cr 1.04, eGFR 59, Ca 10.5, CBC wnl, LDL 55, HDL 34, UDS negative, alcohol < 15 Imaging:  CT Head showed: 1. No acute intracranial abnormality. 2. Progressive periventricular white matter disease bilaterally. CT Angio Head/Neck showed: 1. No large vessel occlusion or flow limiting proximal intracranial arterial stenosis. 2. Moderate stenosis versus artifact at the right vertebral artery origin. MRI Brain showed: 1. Small acute ischemic nonhemorrhagic subcortical left frontal lobe infarct. 2. Underlying moderate chronic microvascular ischemic disease with a few scattered remote lacunar infarcts about the deep gray nuclei and pons. Received: Asprin and Plavix   Consulted: Neurology, IMTS  Meds:  Patient reported:  Atorvastatin  40 mg daily Amlodipine -benazepril  10-20 mg daily, last taken this morning   No outpatient medications have been marked as taking for the 02/26/24 encounter [facility] Encounter).    Past Medical History HTN Migraines with Aura OA   Past Surgical History Past Surgical History:  Procedure Laterality Date  . TONSILLECTOMY       Social:  Lives  With: Daughter  Occupation: Works at [employer]  Support: Family  Level of Function: Independent in ADLs and iADLs but does not drive PCP:  Rosan Dayton BROCKS, DO  Substances: -Tobacco: Yes, current 0.5 PPD smoking. Trying to quit and was prescribed Chantix , but had not started it yet  -Alcohol: Occasionally  -Recreational Drug: None  Family History:  Family History  Problem Relation Age of Onset  . Hypertension Mother   . Stroke Mother   . Hypertension Father   . Stroke Father   . Heart attack Sister   . Breast cancer Neg Hx       Allergies: Allergies as of 02/26/2024 - Reviewed 02/26/2024  Allergen Reaction Noted  . Naprosyn  [naproxen ] Itching 02/21/2017    Review of Systems: A complete ROS was negative except as per HPI.   OBJECTIVE:   Physical Exam: Blood pressure 129/62, pulse 72, temperature 98.1 F (36.7 C), temperature source Oral, resp. rate 20, SpO2 98%.   Physical Exam:   Constitutional: well-appearing female sitting in hospital bed , in no acute distress.  HEENT: normocephalic atraumatic, mucous membranes moist Eyes: conjunctiva non-erythematous Neck: supple Cardiovascular: regular rate and rhythm, bilateral radial pulses 2+, bilateral dorsal pedal pulses 2+, brisk capillary refill bilateral feet and hands  Pulmonary/Chest: normal work of breathing on room air, lungs clear to auscultation bilaterally Abdominal: soft, non-tender, non-distended MSK: normal bulk and tone. Neuro: *MS: A&O x4. Follows multi-step commands.  *Speech: no dysarthria or aphasia, able to name and repeat. *CN:    I: Deferred   II,III: PERRLA, VFF by confrontation   III,IV,VI: EOMI w/o nystagmus, no ptosis   V: Sensation intact V1; deferred V3 to LT    VII: Eyelid closure was full.  Smile symmetric.   VIII: Hearing intact to voice   IX,X: Voice normal   XI: deferred due to above  XII: Tongue protrudes midline, no atrophy or fasciculations  *Motor:   Normal bulk.  No tremor, rigidity or bradykinesia. No pronator drift.   Strength: Dlt Bic Tri WE WrF FgS Gr HF KnF KnE PlF DoF    Left 5 5 5 5 5 5 5 5 5 5 5 5     Right 5 5 5 5 5 5 5 5 5 5 5 5    *Sensory: Intact to light touch, pinprick, temperature vibration throughout. Symmetric. Propioception intact bilat.  No double-simultaneous extinction.  *Coordination: heel-to-shin intact. *Reflexes:  deferred *Gait: able to ambulate with assistance to restroom  Skin: warm and dry, no ulcers or lesions on bilateral feet Psych: mood calm, behavior normal, thought content  normal, judgement normal until reaching CNV testing. After V1 testing, patient immediately recoiled from light touch to her forehead, endorsing intense headache and then suddenly began questioning who the people in the room were Hydrographic surveyor, Dr. Kandis, and patient's daughter), she began to have aphasia but could write out her name when asked. She had no other focal deficits and when asked if she had any other symptoms aside from headache and word-finding difficulty she shook her head no. After a 10 minute break, continued the rest of the Neuro exam which was normal.    Labs: CBC    Component Value Date/Time   WBC 5.7 02/26/2024 1649   RBC 4.20 02/26/2024 1649   HGB 12.2 02/26/2024 1654   HGB 12.4 12/30/2023 0948   HCT 36.0 02/26/2024 1654   HCT 37.0 12/30/2023 0948   PLT 240 02/26/2024 1649   PLT 255 12/30/2023 0948  MCV 84.5 02/26/2024 1649   MCV 86 12/30/2023 0948   MCH 29.0 02/26/2024 1649   MCHC 34.4 02/26/2024 1649   RDW 15.4 02/26/2024 1649   RDW 14.8 12/30/2023 0948   LYMPHSABS 2.8 02/26/2024 1649   LYMPHSABS 2.5 12/30/2023 0948   MONOABS 0.4 02/26/2024 1649   EOSABS 0.0 02/26/2024 1649   EOSABS 0.0 12/30/2023 0948   BASOSABS 0.0 02/26/2024 1649   BASOSABS 0.0 12/30/2023 0948     CMP     Component Value Date/Time   NA 143 02/26/2024 1654   NA 141 12/30/2023 0948   K 3.5 02/26/2024 1654   CL 104 02/26/2024 1654   CO2 25 02/26/2024 1649   GLUCOSE 102 (H) 02/26/2024 1654   BUN 10 02/26/2024 1654   BUN 10 12/30/2023 0948   CREATININE 1.20 (H) 02/26/2024 1654   CREATININE 0.56 09/08/2014 1055   CALCIUM  10.5 (H) 02/26/2024 1649   PROT 8.3 (H) 02/26/2024 1649   PROT 7.6 12/30/2023 0948   ALBUMIN 4.1 02/26/2024 1649   ALBUMIN 4.4 12/30/2023 0948   AST 26 02/26/2024 1649   ALT 18 02/26/2024 1649   ALKPHOS 98 02/26/2024 1649   BILITOT 0.9 02/26/2024 1649   BILITOT 0.6 12/30/2023 0948   GFRNONAA 59 (L) 02/26/2024 1649   GFRNONAA >89 09/08/2014 1055   GFRAA 85  10/07/2019 1023   GFRAA >89 09/08/2014 1055    Imaging:  MR BRAIN WO CONTRAST Result Date: 02/26/2024 CLINICAL DATA:  Follow-up examination for acute stroke. EXAM: MRI HEAD WITHOUT CONTRAST TECHNIQUE: Multiplanar, multiecho pulse sequences of the brain and surrounding structures were obtained without intravenous contrast. COMPARISON:  Prior CTs from earlier the same day. FINDINGS: Brain: Cerebral volume within normal limits. Patchy and confluent T2/FLAIR hyperintensity involving the periventricular deep white matter both cerebral hemispheres as well as the pons, consistent with chronic small vessel ischemic disease, moderate in nature. Few scattered superimposed remote lacunar infarcts noted about the deep gray nuclei and pons. Small focus of serpiginous diffusion signal abnormality seen involving the subcortical left frontal lobe, likely reflecting a small acute ischemic infarct (series 2, images 34, 32). Subtle associated signal loss on ADC map. No associated hemorrhage or mass effect. No other evidence for acute or subacute ischemia. Gray-white matter differentiation otherwise maintained. No acute or chronic intracranial blood products. No mass lesion, midline shift or mass effect. No hydrocephalus or extra-axial fluid collection. Pituitary gland within normal limits. Vascular: Major intracranial vascular flow voids are maintained. Skull and upper cervical spine: Craniocervical junction within normal limits. Bone marrow signal intensity overall within normal limits. No scalp soft tissue abnormality. Sinuses/Orbits: Globes and orbital soft tissues within normal limits. Mild scattered mucosal thickening present about the ethmoidal air cells. Paranasal sinuses are otherwise largely clear. No significant mastoid effusion. Other: None. IMPRESSION: 1. Small acute ischemic nonhemorrhagic subcortical left frontal lobe infarct. 2. Underlying moderate chronic microvascular ischemic disease with a few scattered  remote lacunar infarcts about the deep gray nuclei and pons. Electronically Signed   By: Morene Hoard M.D.   On: 02/26/2024 19:39   CT ANGIO HEAD NECK W WO CM (CODE STROKE) Result Date: 02/26/2024 CLINICAL DATA:  Neuro deficit, acute, stroke suspected. EXAM: CT ANGIOGRAPHY HEAD AND NECK WITH AND WITHOUT CONTRAST TECHNIQUE: Multidetector CT imaging of the head and neck was performed using the standard protocol during bolus administration of intravenous contrast. Multiplanar CT image reconstructions and MIPs were obtained to evaluate the vascular anatomy. Carotid stenosis measurements (when applicable) are obtained utilizing NASCET  criteria, using the distal internal carotid diameter as the denominator. RADIATION DOSE REDUCTION: This exam was performed according to the departmental dose-optimization program which includes automated exposure control, adjustment of the mA and/or kV according to patient size and/or use of iterative reconstruction technique. CONTRAST:  75mL OMNIPAQUE  IOHEXOL  350 MG/ML SOLN COMPARISON:  None Available. FINDINGS: CTA NECK FINDINGS Aortic arch: Normal variant aortic arch branching pattern with common origin of the brachiocephalic and left common carotid arteries. Mild atherosclerosis without a significant stenosis of the arch vessel origins. Soft plaque in the proximal left subclavian artery resulting in less than 50% stenosis. Right carotid system: Patent with predominantly calcified plaque at the carotid bifurcation. No evidence of a significant stenosis or dissection. Left carotid system: Patent with minimal calcified plaque at the carotid bifurcation. No evidence of a significant stenosis or dissection. Vertebral arteries: Patent with the right being mildly dominant. Moderate stenosis versus artifact from streak and motion at the right vertebral origin. No evidence of a significant stenosis or dissection elsewhere. Skeleton: Mild cervical disc degeneration. Advanced right  facet arthrosis at C2-3 and C3-4. Other neck: No evidence of cervical lymphadenopathy or mass. 8 mm left thyroid nodule for which no follow-up imaging is recommended. Upper chest: Centrilobular emphysema. Review of the MIP images confirms the above findings CTA HEAD FINDINGS Anterior circulation: The internal carotid arteries are patent from skull base to carotid termini with mild atherosclerotic irregularity but no significant stenosis. ACAs and MCAs are patent without evidence of a proximal branch occlusion or significant proximal stenosis. No aneurysm is identified. Posterior circulation: The intracranial vertebral arteries are patent to the basilar. Patent right PICA, left AICA, and bilateral SCA origins are visualized. The basilar artery is patent with irregularity in up to mild narrowing proximally. There is a patent right posterior communicating artery. Both PCAs are patent without evidence of a significant proximal stenosis. No aneurysm is identified. Venous sinuses: Patent. Anatomic variants: None. Review of the MIP images confirms the above findings These results were communicated to Dr. Michaela at 5:07 pm on 02/26/2024 by text page via the [facility] messaging system. IMPRESSION: 1. No large vessel occlusion or flow limiting proximal intracranial arterial stenosis. 2. Moderate stenosis versus artifact at the right vertebral artery origin. 3. Cervical carotid atherosclerosis without stenosis. 4. Aortic Atherosclerosis (ICD10-I70.0) and Emphysema (ICD10-J43.9). Electronically Signed   By: Dasie Hamburg M.D.   On: 02/26/2024 17:15   CT HEAD CODE STROKE WO CONTRAST Result Date: 02/26/2024 EXAM: CT HEAD WITHOUT CONTRAST 02/26/2024 04:56:00 PM TECHNIQUE: CT of the head was performed without the administration of intravenous contrast. Automated exposure control, iterative reconstruction, and/or weight based adjustment of the mA/kV was utilized to reduce the radiation dose to as low as reasonably achievable.  COMPARISON: CT head without contrast 08/24/2014. CLINICAL HISTORY: Neuro deficit, acute, stroke suspected. Code Stroke. FINDINGS: BRAIN AND VENTRICLES: No acute hemorrhage. No evidence of acute infarct. No hydrocephalus. No extra-axial collection. No mass effect or midline shift. Progressive periventricular white matter disease is present bilaterally. ORBITS: No acute abnormality. SINUSES: No acute abnormality. SOFT TISSUES AND SKULL: No acute soft tissue abnormality. No skull fracture. [place] stroke program early CT (ASPECTS) score: Ganglionic (caudate, internal capsule, lentiform nucleus, insula, M1-M3): 7 Supraganglionic (M4-M6): 3 Total: 10 The pertinent results were texted to Dr. Michaela via the [facility] system at 5:00 PM. IMPRESSION: 1. No acute intracranial abnormality. 2. Progressive periventricular white matter disease bilaterally. Electronically signed by: Lonni Necessary MD 02/26/2024 05:01 PM EDT RP Workstation: [id number]  EKG: personally reviewed my interpretation is sinus rhythm with PVCs and no evidence of A-fib or acute ischemic changes.   ASSESSMENT & PLAN:   Assessment & Plan by Problem: Principal Problem:   Stroke (HCC) Active Problems:   Essential hypertension   Tobacco abuse   Hyperlipidemia   Gastroesophageal reflux disease   Elevated serum creatinine   Hypokalemia   Laurie Contreras is a 68 y.o. person living with a history of HTN and migraines with aura who presents to [facility] ED with speech finding difficulty and headache and admitted for stroke work up on hospital day 0  ##Ischemic Left Frontal Infarct #Aphasia #Headache Patient presented with complaints of aphasia and headache, last known normal around 1300 on 9/24. She was at lunch with coworkers when she suddenly experienced aphasia and severe headache. She was admitted to [facility] ED and returned to her normal. CT Head showed no acute intracranial abnormality, and progressive periventricular white matter disease  bilaterally. CT Angio Head/Neck showed no large vessel occlusion or flow limiting proximal intracranial arterial stenosis, and moderate stenosis versus artifact at the right vertebral artery origin. Neuro was consulted who felt that symptoms were likely due to migraine with aura and Functional Neurological Disorder. Neuro obtained MRI Brain which showed small acute ischemic nonhemorrhagic subcortical left frontal lobe infarct, and underlying moderate chronic microvascular ischemic disease with a few scattered remote lacunar infarcts about the deep gray nuclei and pons. During interview with patient she reported she was at baseline, appropriately answering questions and providing history of the day. Upon physical exam portion, patient tolerated heart, lung, abdomen exam without issue. Mood was calm, behavior normal, thought content normal, judgement normal until reaching CNV testing. After V1 testing, patient immediately recoiled from light touch to her forehead, endorsing intense headache and then suddenly began questioning who the people in the room were Hydrographic surveyor, Dr. Kandis, and patient's daughter), she began to have aphasia but could write out her name when asked. She had no other focal deficits and when asked if she had any other symptoms aside from headache and word-finding difficulty she shook her head no. She became incredibly agitated and glaring at everyone in the room. After a 5 minute break, patient went to the bathroom with her daughter and returned in her normal state without any aphasia. Continued the rest of the Neuro exam which was normal. 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 This may explain the sudden confused state during interview, but patient returned to her baseline within minutes and unclear if that aligns with her infarct. LDL appropriate at 55. A1C from 12/30/2023 was 6.2.  - Frequent neuro  checks - f/u Echocardiogram - Carotid dopplers - Aspirin  81 mg daily - Plavix  75 mg daily  - Continue home Atorvastatin  40 mg daily  - Allow for permissive hypertension x48 hrs  - Risk factor modification - Telemetry monitoring - PT consult, OT consult - Stroke team to follow   #HTN In the setting of ischemic left frontal infarct and stroke work up, allowing for permissive hypertension as above so will hold home Amlodipine -benazepril  10-20 mg daily. Has last dose was 9/24 AM and she reports complete adherence to this medication.  - Hold home Amlodipine -benazepril  10-20 mg daily for permissive hypertension   #Elevated Cr Cr 1.04, eGFR 59 on admission. Baseline appears to be closer to 0.8. This may be in the setting of dehydration as patient had not  eaten or drank since this morning as lunch was interrupted beforehand. She passed her swallow study,  - f/u BMP   #Hypokalemia Patient with borderline low potassium of 3.4 on admission.  - KCl 40 mEq once - f/u BMP   #Tobacco Use Disorder Patient endorses 0.5 PPD smoking. She has been trying to quit and was prescribed Chantix , but had not started it yet. She would like a nicotine  patch.  - Nicotine  patch 14mg  daily   #HLD  Patient takes Atorvastatin  40 mg daily which she reports complete adherence to. Lipid panel today showed LDL 55, HDL 34, cholesterol 107, triglycerides 88, VLDL 18. Given lipids are appropriate and patient is already on high intensity statin dosing, will continue her home dose.  - Continue home Atorvastatin  40 mg daily  #GERD - Pantoprazole  40 mg daily   Best practice: Diet: Normal VTE: Enoxaparin  Code: DNR/DNI  Disposition planning: Prior to Admission Living Arrangement: Home, living with daughter Anticipated Discharge Location: Home  Dispo: Admit patient to Observation with expected length of stay less than 2 midnights.  Signed: Doyal Miyamoto, MD Internal Medicine Resident  02/26/2024, 10:23 PM  On Call  pager: [phone]

## 2024-02-26 NOTE — H&P (Signed)
 Date: 02/27/2024               Patient Name:  Laurie Contreras MRN: [id number]  DOB: [birth year] Age / Sex: 68 y.o., female   PCP: Rosan Dayton BROCKS, DO         Medical Service: Internal Medicine Teaching Service         Attending Physician: Dr. Reyes Fenton      First Contact: Doyal Miyamoto, MD    Second Contact: Dr. Damien Lease, DO         Pager Information: First Contact Pager: [phone]   Second Contact Pager: [phone]   SUBJECTIVE   Chief Complaint: Speech difficulty   History of Present Illness: Laurie Contreras is a 68 y.o. female with a history of HTN and migraines with aura who presents to [facility] ED with speech finding difficulty and headache.   Patient resting comfortably in bed with daughter at bedside upon entry for interview. She states that she was in her normal state of health, and went to lunch around 1500 today with her co-worker. She states that her head felt funny and she was trying to tell her coworker that she needed to sit down, but the next thing she knew she had fallen on the floor. She states that she felt like she couldn't get her words out of her mouth and began to have a terrible headache 10/10 pain. She denies hitting her head, and reports that she fell on her left side, and is not experiencing any pain in that area now. EMS arrived at the scene to bring her to [facility] ED. Patient and daughter report that her speech finding difficulty improved and she returned to her baseline while in the ED before we arrived. She reports that her headache which is global, has improved now to 6/10 pain and she feels at baseline with her speech.  She reports a past history of migraines with aura, photophobia and phonophobia. She reports that if she takes an Excedrin Migraine at the start, then they typically go away, but she often has to leave work and rest in a dark room at home.   ROS: Denies dizziness, fever, chills, runny nose, sore throat, vision changes, hearing changes, chest  pain, shortness of breath, difficulty breathing, nausea, vomiting, abdominal pain. Denies increased urinary frequency, pain with urination, constipation or diarrhea. Recent fall on her left side without LOC or hitting her head; reports no pain currently. Endorses 6/10 headache.    Of note, patient had a similar presentation in 2016 in the setting of multiple stressors where she had difficulty walking and was assessed as a code stroke which was felt to be most likely functional at that time.   ED Course: Labs: K 3.4, Cr 1.04, eGFR 59, Ca 10.5, CBC wnl, LDL 55, HDL 34, UDS negative, alcohol < 15 Imaging:  CT Head showed: 1. No acute intracranial abnormality. 2. Progressive periventricular white matter disease bilaterally. CT Angio Head/Neck showed: 1. No large vessel occlusion or flow limiting proximal intracranial arterial stenosis. 2. Moderate stenosis versus artifact at the right vertebral artery origin. MRI Brain showed: 1. Small acute ischemic nonhemorrhagic subcortical left frontal lobe infarct. 2. Underlying moderate chronic microvascular ischemic disease with a few scattered remote lacunar infarcts about the deep gray nuclei and pons. Received: Asprin and Plavix   Consulted: Neurology, IMTS  Meds:  Patient reported:  Atorvastatin  40 mg daily Amlodipine -benazepril  10-20 mg daily, last taken this morning   Current Meds  Medication Sig  amLODipine -benazepril  (LOTREL) 10-20 MG capsule Take 1 capsule by mouth daily.   atorvastatin  (LIPITOR) 40 MG tablet Take 1 tablet (40 mg total) by mouth daily.   diphenhydrAMINE  (BENADRYL ) 25 mg capsule Take 2 capsules (50 mg total) by mouth every 6 (six) hours as needed for allergies.   Multiple Vitamin (MULTIVITAMIN WITH MINERALS) TABS tablet Take 1 tablet by mouth daily.   omeprazole  (PRILOSEC OTC) 20 MG tablet Take 20 mg by mouth daily as needed.    Past Medical History HTN Migraines with Aura OA   Past Surgical History Past Surgical History:   Procedure Laterality Date   TONSILLECTOMY       Social:  Lives With: Daughter  Occupation: Works at [employer]  Support: Family  Level of Function: Independent in ADLs and iADLs but does not drive PCP:  Rosan Dayton BROCKS, DO  Substances: -Tobacco: Yes, current 0.5 PPD smoking. Trying to quit and was prescribed Chantix , but had not started it yet  -Alcohol: Occasionally  -Recreational Drug: None  Family History:  Family History  Problem Relation Age of Onset   Hypertension Mother    Stroke Mother    Hypertension Father    Stroke Father    Heart attack Sister    Breast cancer Neg Hx      Allergies: Allergies as of 02/26/2024 - Review Complete 02/26/2024  Allergen Reaction Noted   Naprosyn  [naproxen ] Itching 02/21/2017    Review of Systems: A complete ROS was negative except as per HPI.   OBJECTIVE:   Physical Exam: Blood pressure 129/62, pulse 72, temperature 98.1 F (36.7 C), temperature source Oral, resp. rate 20, SpO2 98%.   Physical Exam:   Constitutional: well-appearing female sitting in hospital bed , in no acute distress.  HEENT: normocephalic atraumatic, mucous membranes moist Eyes: conjunctiva non-erythematous Neck: supple Cardiovascular: regular rate and rhythm, bilateral radial pulses 2+, bilateral dorsal pedal pulses 2+, brisk capillary refill bilateral feet and hands  Pulmonary/Chest: normal work of breathing on room air, lungs clear to auscultation bilaterally Abdominal: soft, non-tender, non-distended MSK: normal bulk and tone. Neuro: *MS: A&O x4. Follows multi-step commands.  *Speech: no dysarthria or aphasia, able to name and repeat. *CN:    I: Deferred   II,III: PERRLA, VFF by confrontation   III,IV,VI: EOMI w/o nystagmus, no ptosis   V: Sensation intact V1; deferred V3 to LT    VII: Eyelid closure was full.  Smile symmetric.   VIII: Hearing intact to voice   IX,X: Voice normal   XI: deferred due to above  XII: Tongue protrudes  midline, no atrophy or fasciculations  *Motor:   Normal bulk.  No tremor, rigidity or bradykinesia. No pronator drift.   Strength: Dlt Bic Tri WE WrF FgS Gr HF KnF KnE PlF DoF    Left 5 5 5 5 5 5 5 5 5 5 5 5     Right 5 5 5 5 5 5 5 5 5 5 5 5    *Sensory: Intact to light touch, pinprick, temperature vibration throughout. Symmetric. Propioception intact bilat.  No double-simultaneous extinction.  *Coordination: heel-to-shin intact. *Reflexes:  deferred *Gait: able to ambulate with assistance to restroom  Skin: warm and dry, no ulcers or lesions on bilateral feet Psych: mood calm, behavior normal, thought content normal, judgement normal until reaching CNV testing. After V1 testing, patient immediately recoiled from light touch to her forehead, endorsing intense headache and then suddenly began questioning who the people in the room were Hydrographic surveyor, Dr. Kandis, and  patient's daughter), she began to have aphasia but could write out her name when asked. She had no other focal deficits and when asked if she had any other symptoms aside from headache and word-finding difficulty she shook her head no. After a 5 minute break, patient went to the bathroom with her daughter and returned in her normal state without any aphasia. She was able to ambulate with assistance.   Labs: CBC    Component Value Date/Time   WBC 5.7 02/26/2024 1649   RBC 4.20 02/26/2024 1649   HGB 12.2 02/26/2024 1654   HGB 12.4 12/30/2023 0948   HCT 36.0 02/26/2024 1654   HCT 37.0 12/30/2023 0948   PLT 240 02/26/2024 1649   PLT 255 12/30/2023 0948   MCV 84.5 02/26/2024 1649   MCV 86 12/30/2023 0948   MCH 29.0 02/26/2024 1649   MCHC 34.4 02/26/2024 1649   RDW 15.4 02/26/2024 1649   RDW 14.8 12/30/2023 0948   LYMPHSABS 2.8 02/26/2024 1649   LYMPHSABS 2.5 12/30/2023 0948   MONOABS 0.4 02/26/2024 1649   EOSABS 0.0 02/26/2024 1649   EOSABS 0.0 12/30/2023 0948   BASOSABS 0.0 02/26/2024 1649   BASOSABS 0.0 12/30/2023 0948      CMP     Component Value Date/Time   NA 143 02/26/2024 1654   NA 141 12/30/2023 0948   K 3.5 02/26/2024 1654   CL 104 02/26/2024 1654   CO2 25 02/26/2024 1649   GLUCOSE 102 (H) 02/26/2024 1654   BUN 10 02/26/2024 1654   BUN 10 12/30/2023 0948   CREATININE 1.20 (H) 02/26/2024 1654   CREATININE 0.56 09/08/2014 1055   CALCIUM  10.5 (H) 02/26/2024 1649   PROT 8.3 (H) 02/26/2024 1649   PROT 7.6 12/30/2023 0948   ALBUMIN 4.1 02/26/2024 1649   ALBUMIN 4.4 12/30/2023 0948   AST 26 02/26/2024 1649   ALT 18 02/26/2024 1649   ALKPHOS 98 02/26/2024 1649   BILITOT 0.9 02/26/2024 1649   BILITOT 0.6 12/30/2023 0948   GFRNONAA 59 (L) 02/26/2024 1649   GFRNONAA >89 09/08/2014 1055   GFRAA 85 10/07/2019 1023   GFRAA >89 09/08/2014 1055    Imaging:  MR BRAIN WO CONTRAST Result Date: 02/26/2024 CLINICAL DATA:  Follow-up examination for acute stroke. EXAM: MRI HEAD WITHOUT CONTRAST TECHNIQUE: Multiplanar, multiecho pulse sequences of the brain and surrounding structures were obtained without intravenous contrast. COMPARISON:  Prior CTs from earlier the same day. FINDINGS: Brain: Cerebral volume within normal limits. Patchy and confluent T2/FLAIR hyperintensity involving the periventricular deep white matter both cerebral hemispheres as well as the pons, consistent with chronic small vessel ischemic disease, moderate in nature. Few scattered superimposed remote lacunar infarcts noted about the deep gray nuclei and pons. Small focus of serpiginous diffusion signal abnormality seen involving the subcortical left frontal lobe, likely reflecting a small acute ischemic infarct (series 2, images 34, 32). Subtle associated signal loss on ADC map. No associated hemorrhage or mass effect. No other evidence for acute or subacute ischemia. Gray-white matter differentiation otherwise maintained. No acute or chronic intracranial blood products. No mass lesion, midline shift or mass effect. No hydrocephalus or  extra-axial fluid collection. Pituitary gland within normal limits. Vascular: Major intracranial vascular flow voids are maintained. Skull and upper cervical spine: Craniocervical junction within normal limits. Bone marrow signal intensity overall within normal limits. No scalp soft tissue abnormality. Sinuses/Orbits: Globes and orbital soft tissues within normal limits. Mild scattered mucosal thickening present about the ethmoidal air cells. Paranasal sinuses are  otherwise largely clear. No significant mastoid effusion. Other: None. IMPRESSION: 1. Small acute ischemic nonhemorrhagic subcortical left frontal lobe infarct. 2. Underlying moderate chronic microvascular ischemic disease with a few scattered remote lacunar infarcts about the deep gray nuclei and pons. Electronically Signed   By: Morene Hoard M.D.   On: 02/26/2024 19:39   CT ANGIO HEAD NECK W WO CM (CODE STROKE) Result Date: 02/26/2024 CLINICAL DATA:  Neuro deficit, acute, stroke suspected. EXAM: CT ANGIOGRAPHY HEAD AND NECK WITH AND WITHOUT CONTRAST TECHNIQUE: Multidetector CT imaging of the head and neck was performed using the standard protocol during bolus administration of intravenous contrast. Multiplanar CT image reconstructions and MIPs were obtained to evaluate the vascular anatomy. Carotid stenosis measurements (when applicable) are obtained utilizing NASCET criteria, using the distal internal carotid diameter as the denominator. RADIATION DOSE REDUCTION: This exam was performed according to the departmental dose-optimization program which includes automated exposure control, adjustment of the mA and/or kV according to patient size and/or use of iterative reconstruction technique. CONTRAST:  75mL OMNIPAQUE  IOHEXOL  350 MG/ML SOLN COMPARISON:  None Available. FINDINGS: CTA NECK FINDINGS Aortic arch: Normal variant aortic arch branching pattern with common origin of the brachiocephalic and left common carotid arteries. Mild  atherosclerosis without a significant stenosis of the arch vessel origins. Soft plaque in the proximal left subclavian artery resulting in less than 50% stenosis. Right carotid system: Patent with predominantly calcified plaque at the carotid bifurcation. No evidence of a significant stenosis or dissection. Left carotid system: Patent with minimal calcified plaque at the carotid bifurcation. No evidence of a significant stenosis or dissection. Vertebral arteries: Patent with the right being mildly dominant. Moderate stenosis versus artifact from streak and motion at the right vertebral origin. No evidence of a significant stenosis or dissection elsewhere. Skeleton: Mild cervical disc degeneration. Advanced right facet arthrosis at C2-3 and C3-4. Other neck: No evidence of cervical lymphadenopathy or mass. 8 mm left thyroid nodule for which no follow-up imaging is recommended. Upper chest: Centrilobular emphysema. Review of the MIP images confirms the above findings CTA HEAD FINDINGS Anterior circulation: The internal carotid arteries are patent from skull base to carotid termini with mild atherosclerotic irregularity but no significant stenosis. ACAs and MCAs are patent without evidence of a proximal branch occlusion or significant proximal stenosis. No aneurysm is identified. Posterior circulation: The intracranial vertebral arteries are patent to the basilar. Patent right PICA, left AICA, and bilateral SCA origins are visualized. The basilar artery is patent with irregularity in up to mild narrowing proximally. There is a patent right posterior communicating artery. Both PCAs are patent without evidence of a significant proximal stenosis. No aneurysm is identified. Venous sinuses: Patent. Anatomic variants: None. Review of the MIP images confirms the above findings These results were communicated to Dr. Michaela at 5:07 pm on 02/26/2024 by text page via the [facility] messaging system. IMPRESSION: 1. No large vessel  occlusion or flow limiting proximal intracranial arterial stenosis. 2. Moderate stenosis versus artifact at the right vertebral artery origin. 3. Cervical carotid atherosclerosis without stenosis. 4. Aortic Atherosclerosis (ICD10-I70.0) and Emphysema (ICD10-J43.9). Electronically Signed   By: Dasie Hamburg M.D.   On: 02/26/2024 17:15   CT HEAD CODE STROKE WO CONTRAST Result Date: 02/26/2024 EXAM: CT HEAD WITHOUT CONTRAST 02/26/2024 04:56:00 PM TECHNIQUE: CT of the head was performed without the administration of intravenous contrast. Automated exposure control, iterative reconstruction, and/or weight based adjustment of the mA/kV was utilized to reduce the radiation dose to as low as reasonably achievable.  COMPARISON: CT head without contrast 08/24/2014. CLINICAL HISTORY: Neuro deficit, acute, stroke suspected. Code Stroke. FINDINGS: BRAIN AND VENTRICLES: No acute hemorrhage. No evidence of acute infarct. No hydrocephalus. No extra-axial collection. No mass effect or midline shift. Progressive periventricular white matter disease is present bilaterally. ORBITS: No acute abnormality. SINUSES: No acute abnormality. SOFT TISSUES AND SKULL: No acute soft tissue abnormality. No skull fracture. [place] stroke program early CT (ASPECTS) score: Ganglionic (caudate, internal capsule, lentiform nucleus, insula, M1-M3): 7 Supraganglionic (M4-M6): 3 Total: 10 The pertinent results were texted to Dr. Michaela via the [facility] system at 5:00 PM. IMPRESSION: 1. No acute intracranial abnormality. 2. Progressive periventricular white matter disease bilaterally. Electronically signed by: Lonni Necessary MD 02/26/2024 05:01 PM EDT RP Workstation: [id number]     EKG: personally reviewed my interpretation is sinus rhythm with PVCs and no evidence of A-fib or acute ischemic changes.   ASSESSMENT & PLAN:   Assessment & Plan by Problem: Principal Problem:   Acute ischemic stroke (HCC) Active Problems:   Essential  hypertension   Tobacco abuse   Hyperlipidemia   Gastroesophageal reflux disease   Elevated serum creatinine   Hypokalemia   Laurie Contreras is a 68 y.o. person living with a history of HTN and migraines with aura who presents to [facility] ED with speech finding difficulty and headache and admitted for stroke work up on hospital day 0  ##Ischemic Left Frontal Infarct #Aphasia #Headache Patient presented with complaints of aphasia and headache, last known normal around 1300 on 9/24. She was at lunch with coworkers when she suddenly experienced aphasia and severe headache. She was admitted to [facility] ED and returned to her normal. CT Head showed no acute intracranial abnormality, and progressive periventricular white matter disease bilaterally. CT Angio Head/Neck showed no large vessel occlusion or flow limiting proximal intracranial arterial stenosis, and moderate stenosis versus artifact at the right vertebral artery origin. LDL appropriate at 55. A1C from 12/30/2023 was 6.2. Neuro was consulted who felt that symptoms were likely due to migraine with aura and Functional Neurological Disorder. Neuro obtained MRI Brain which showed small acute ischemic nonhemorrhagic subcortical left frontal lobe infarct, and underlying moderate chronic microvascular ischemic disease with a few scattered remote lacunar infarcts about the deep gray nuclei and pons. During interview with patient she reported she was at baseline, appropriately answering questions and providing history of the day. Global headache had improved from 10 earlier in the day to 6/10. Upon physical exam portion, patient tolerated heart, lung, abdomen exam without issue. Mood was calm, behavior normal, thought content normal, judgement normal until reaching CNV testing. After V1 testing, patient immediately recoiled from light touch to her forehead, endorsing intense headache and then suddenly began questioning who the people in the room were Hydrographic surveyor, Dr.  Kandis, and patient's daughter), she began to have aphasia but could write out her name when asked. She had no other focal deficits and when asked if she had any other symptoms aside from headache and word-finding difficulty she shook her head no. She became incredibly agitated and glaring at everyone in the room. After a 5 minute break, patient went to the bathroom with her daughter and returned in her normal state without any aphasia. Continued the rest of the Neuro exam which was normal. 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 This may explain the sudden confused state during interview, but patient returned to her baseline  within minutes and unclear if that aligns with her infarct. There may also be a component of FND as queried by Neurology.  - Frequent neuro checks - f/u Echocardiogram - Start Aspirin  81 mg daily - Start Plavix  75 mg daily  - Tylenol  650 mg q6 hrs prn pain/headache  - Continue home Atorvastatin  40 mg daily  - Allow for permissive hypertension x48 hrs  - Risk factor modification - Telemetry monitoring - PT consult, OT consult - Stroke team to follow  #HTN In the setting of ischemic left frontal infarct and stroke work up, allowing for permissive hypertension as above so will hold home Amlodipine -benazepril  10-20 mg daily. Has last dose was 9/24 AM and she reports complete adherence to this medication.  - Hold home Amlodipine -benazepril  10-20 mg daily for permissive hypertension   #Elevated Cr Cr 1.04, eGFR 59 on admission. Baseline appears to be closer to 0.8. This may be in the setting of dehydration as patient had not eaten or drank since this morning as lunch was interrupted beforehand. She passed her swallow study,  - f/u BMP   #Hypokalemia Patient with borderline low potassium of 3.4 on admission.  - KCl 40 mEq once - f/u BMP   #Tobacco Use  Disorder Patient endorses 0.5 PPD smoking. She has been trying to quit and was prescribed Chantix , but had not started it yet. She would like a nicotine  patch.  - Nicotine  patch 14mg  daily   #HLD  Patient takes Atorvastatin  40 mg daily which she reports complete adherence to. Lipid panel today showed LDL 55, HDL 34, cholesterol 107, triglycerides 88, VLDL 18. Given lipids are appropriate and patient is already on high intensity statin dosing, will continue her home dose.  - Continue home Atorvastatin  40 mg daily  #GERD - Pantoprazole  40 mg daily    Best practice: Diet: Normal VTE: Enoxaparin  Code: DNR/DNI  Disposition planning: Prior to Admission Living Arrangement: Home, living with daughter Anticipated Discharge Location: Home  Dispo: Admit patient to Observation with expected length of stay less than 2 midnights.  Signed: Doyal Miyamoto, MD Internal Medicine Resident  02/27/2024, 12:14 AM  On Call pager: [phone]

## 2024-02-26 NOTE — Code Documentation (Signed)
 Stroke Response Nurse Documentation Code Documentation  Laurie Contreras is a 68 y.o. female arriving to [facility] via [facility] as Code Stroke activation. Patient coming from work with LKW 1510 when coworkers stated she collapsed and was not talking. Pt endorses dizziness and headache.   Stroke team met pt at bridge on arrival, labs drawn, airway cleared by EDP Emil and pt taken to CT. NIH 0. CT/CTA completed. No TNK as pt rapidly improving. No thrombectomy due to low suspicion for stroke. Care Plan: NIH/Vitals q2h x12h then per unit routine. Bedside handoff with ED RN Mariel.    Tonna Lacks K  Rapid Response RN

## 2024-02-26 NOTE — ED Triage Notes (Signed)
 Pt bib [facility] coming from work. Pt reportedly fell to the ground and went unresponsive. Per staff at work. LWK roughly 1500 today. Pt with aphasia during triage process. Pt does report headache and dizziness. Pt not on blood thinners. Pt was hypertensive with EMS: 190/90.

## 2024-02-26 NOTE — Consult Note (Signed)
 NEUROLOGY CONSULT NOTE   Date of service: February 26, 2024 Patient Name: Laurie Contreras MRN:  [id number] DOB:  1955-06-17 Chief Complaint: Speech difficulty Requesting Provider: Emil Share, DO  History of Present Illness  Laurie Contreras is a 68 y.o. female with hx of hypertension who presents with speech difficulty that started abruptly while at work.  She states that she initially had a severe headache on the left side of her head, and subsequently noticed that she was having trouble speaking.  She does get headaches that make her want to lay down in a dark room from time to time, but the headache that she is having now feels different than typical.  When EMS arrived, she was still speaking, but she worsened in transport and a code stroke was activated.  Of note in 2016, in the setting of multiple stressors, she had an episode of difficulty walking and was assessed as a code stroke at that time as well and was felt to be most likely functional.  LKW: 1510 Modified rankin score: 0-Completely asymptomatic and back to baseline post- stroke IV Thrombolysis: No, mild resolving symptoms EVT: No LVO  NIHSS components Score: Comment  1a Level of Conscious 0[]  1[]  2[]  3[]      1b LOC Questions 0[]  1[]  2[]       1c LOC Commands 0[]  1[]  2[]       2 Best Gaze 0[]  1[]  2[]       3 Visual 0[]  1[]  2[]  3[]      4 Facial Palsy 0[]  1[]  2[]  3[]      5a Motor Arm - left 0[]  1[x]  2[]  3[]  4[]  UN[]    5b Motor Arm - Right 0[]  1[x]  2[]  3[]  4[]  UN[]    6a Motor Leg - Left 0[]  1[x]  2[]  3[]  4[]  UN[]    6b Motor Leg - Right 0[]  1[x]  2[]  3[]  4[]  UN[]    7 Limb Ataxia 0[]  1[]  2[]  UN[]      8 Sensory 0[]  1[]  2[]  UN[]      9 Best Language 0[]  1[x]  2[]  3[]      10 Dysarthria 0[]  1[]  2[]  UN[]      11 Extinct. and Inattention 0[]  1[]  2[]       TOTAL: 5     Past History   Past Medical History:  Diagnosis Date   Hypertension     Past Surgical History:  Procedure Laterality Date   TONSILLECTOMY      Family  History: Family History  Problem Relation Age of Onset   Hypertension Mother    Stroke Mother    Hypertension Father    Stroke Father    Heart attack Sister    Breast cancer Neg Hx     Social History  reports that she has been smoking cigarettes. She has a 20 pack-year smoking history. She has never used smokeless tobacco. She reports current alcohol use of about 1.0 standard drink of alcohol per week. She reports that she does not use drugs.  Allergies  Allergen Reactions   Naprosyn  [Naproxen ] Itching    Medications  No current facility-administered medications for this encounter.  Current Outpatient Medications:    amLODipine -benazepril  (LOTREL) 10-20 MG capsule, Take 1 capsule by mouth daily., Disp: 90 capsule, Rfl: 3   atorvastatin  (LIPITOR) 40 MG tablet, Take 1 tablet (40 mg total) by mouth daily., Disp: 90 tablet, Rfl: 3   diphenhydrAMINE  (BENADRYL ) 25 mg capsule, Take 2 capsules (50 mg total) by mouth every 6 (six) hours as needed for allergies. (Patient not taking: Reported on  12/30/2023), Disp: 30 capsule, Rfl: 1   Multiple Vitamin (MULTIVITAMIN WITH MINERALS) TABS tablet, Take 1 tablet by mouth daily. (Patient not taking: Reported on 12/30/2023), Disp: , Rfl:    omeprazole  (PRILOSEC OTC) 20 MG tablet, Take 20 mg by mouth daily as needed. (Patient not taking: Reported on 12/30/2023), Disp: , Rfl:    Varenicline  Tartrate, Starter, (CHANTIX  STARTING MONTH PAK) 0.5 MG X 11 & 1 MG X 42 TBPK, Take 0.5 mg by mouth daily for 3 days, THEN 0.5 mg 2 (two) times daily for 4 days, THEN 1 mg 2 (two) times daily., Disp: 53 each, Rfl: 0  Vitals   Vitals:   03/12/2024 1700 03/12/2024 1730 March 12, 2024 1745 12-Mar-2024 1800  BP: (!) 168/80 (!) 165/76 (!) 158/72 (!) 144/81  Pulse: 87 79 77 77  Resp: 19 15 18 16   SpO2: 97% 90% 97% 98%    There is no height or weight on file to calculate BMI.   Physical Exam   Constitutional: Appears well-developed and well-nourished.   Neurologic Examination     Neuro: Mental Status: Patient is awake, she has a stuttering speech pattern that appears nonorganic.  When given time, she does answer questions appropriately, and at times appears relatively fluent when she gets distracted. Cranial Nerves: II: Visual Fields are full. Pupils are equal, round, and reactive to light.   III,IV, VI: EOMI without ptosis or diploplia.  V: Facial sensation is symmetric to temperature VII: Facial movement is symmetric.  VIII: hearing is intact to voice X: Uvula elevates symmetrically XII: tongue is midline without atrophy or fasciculations.  Motor: She has giveaway weakness of all four extremities, initially appearing relatively flaccid in her left upper extremity, however she would hold it aloft when held over her face. Sensory: Sensation is symmetric to light touch and temperature in the arms and legs. Cerebellar: FNF and HKS are intact bilaterally  Labs/Imaging/Neurodiagnostic studies   CBC:  Recent Labs  Lab 2024-03-12 1649 03-12-24 1654  WBC 5.7  --   NEUTROABS 2.4  --   HGB 12.2 12.2  HCT 35.5* 36.0  MCV 84.5  --   PLT 240  --    Basic Metabolic Panel:  Lab Results  Component Value Date   NA 143 12-Mar-2024   K 3.5 03-12-24   CO2 25 2024-03-12   GLUCOSE 102 (H) 2024-03-12   BUN 10 03/12/24   CREATININE 1.20 (H) Mar 12, 2024   CALCIUM  10.5 (H) 03-12-24   GFRNONAA 59 (L) 03-12-24   GFRAA 85 10/07/2019   Lipid Panel:  Lab Results  Component Value Date   LDLCALC 61 12/30/2023   HgbA1c:  Lab Results  Component Value Date   HGBA1C 6.2 (H) 12/30/2023   Urine Drug Screen:     Component Value Date/Time   LABOPIA NONE DETECTED 2024-03-12 1725   COCAINSCRNUR NONE DETECTED 03/12/2024 1725   LABBENZ NONE DETECTED 03/12/2024 1725   AMPHETMU NONE DETECTED 03-12-24 1725   THCU NONE DETECTED Mar 12, 2024 1725   LABBARB NONE DETECTED 03/12/2024 1725    Alcohol Level     Component Value Date/Time   ETH <15 March 12, 2024 1651    INR  Lab Results  Component Value Date   INR 1.0 Mar 12, 2024   APTT  Lab Results  Component Value Date   APTT 38 (H) 03/12/2024   CT Head without contrast(Personally reviewed): Negative  CT angio Head and Neck with contrast(Personally reviewed): Negative for LVO  ASSESSMENT   LACI FRENKEL is a 68 y.o. female  presenting with signs and symptoms most consistent with functional neurological disorder.  She had a similar presentation in 2016, however given the presence of headache, difficult rule out some component of complicated migraine.    To rule out the possibility of embellishment superimposed on real deficits, I do think it would be reasonable to do an MRI as this is not something that she does commonly.  If her MRI is negative, however, I would favor no further workup at this time.  RECOMMENDATIONS  Consider treating with migraine cocktail MRI brain If MRI is negative, no further workup at this time. ______________________________________________________________________    Bonney Aisha Seals, MD [facility]

## 2024-02-27 ENCOUNTER — Observation Stay (HOSPITAL_COMMUNITY)

## 2024-02-27 DIAGNOSIS — Z7982 Long term (current) use of aspirin: Secondary | ICD-10-CM

## 2024-02-27 DIAGNOSIS — Z79899 Other long term (current) drug therapy: Secondary | ICD-10-CM

## 2024-02-27 DIAGNOSIS — R29700 NIHSS score 0: Secondary | ICD-10-CM

## 2024-02-27 DIAGNOSIS — I6381 Other cerebral infarction due to occlusion or stenosis of small artery: Secondary | ICD-10-CM | POA: Diagnosis not present

## 2024-02-27 DIAGNOSIS — I5020 Unspecified systolic (congestive) heart failure: Secondary | ICD-10-CM | POA: Diagnosis not present

## 2024-02-27 DIAGNOSIS — Z7902 Long term (current) use of antithrombotics/antiplatelets: Secondary | ICD-10-CM

## 2024-02-27 DIAGNOSIS — I6389 Other cerebral infarction: Secondary | ICD-10-CM

## 2024-02-27 DIAGNOSIS — I739 Peripheral vascular disease, unspecified: Secondary | ICD-10-CM

## 2024-02-27 DIAGNOSIS — F17210 Nicotine dependence, cigarettes, uncomplicated: Secondary | ICD-10-CM

## 2024-02-27 DIAGNOSIS — E785 Hyperlipidemia, unspecified: Secondary | ICD-10-CM

## 2024-02-27 DIAGNOSIS — I110 Hypertensive heart disease with heart failure: Secondary | ICD-10-CM

## 2024-02-27 LAB — BASIC METABOLIC PANEL WITH GFR
Anion gap: 9 (ref 5–15)
BUN: 7 mg/dL — ABNORMAL LOW (ref 8–23)
CO2: 21 mmol/L — ABNORMAL LOW (ref 22–32)
Calcium: 10.1 mg/dL (ref 8.9–10.3)
Chloride: 109 mmol/L (ref 98–111)
Creatinine, Ser: 0.77 mg/dL (ref 0.44–1.00)
GFR, Estimated: >60 mL/min (ref >60)
Glucose, Bld: 107 mg/dL — ABNORMAL HIGH (ref 70–99)
Potassium: 4 mmol/L (ref 3.5–5.1)
Sodium: 139 mmol/L (ref 135–145)

## 2024-02-27 LAB — HEMOGLOBIN A1C
Hgb A1c MFr Bld: 6 % — ABNORMAL HIGH (ref 4.8–5.6)
Mean Plasma Glucose: 125.5 mg/dL

## 2024-02-27 LAB — ECHOCARDIOGRAM COMPLETE
Area-P 1/2: 5.13 cm2
Calc EF: 51.4 %
Height: 67 in
S' Lateral: 3.6 cm
Single Plane A2C EF: 49.5 %
Single Plane A4C EF: 54.3 %
Weight: 2672 [oz_av]

## 2024-02-27 LAB — CBC
HCT: 34.5 % — ABNORMAL LOW (ref 36.0–46.0)
Hemoglobin: 11.7 g/dL — ABNORMAL LOW (ref 12.0–15.0)
MCH: 28.9 pg (ref 26.0–34.0)
MCHC: 33.9 g/dL (ref 30.0–36.0)
MCV: 85.2 fL (ref 80.0–100.0)
Platelets: 214 K/uL (ref 150–400)
RBC: 4.05 MIL/uL (ref 3.87–5.11)
RDW: 15.3 % (ref 11.5–15.5)
WBC: 4.6 K/uL (ref 4.0–10.5)
nRBC: 0 % (ref 0.0–0.2)

## 2024-02-27 LAB — MAGNESIUM: Magnesium: 1.8 mg/dL (ref 1.7–2.4)

## 2024-02-27 LAB — HIV ANTIBODY (ROUTINE TESTING W REFLEX): HIV Screen 4th Generation wRfx: NONREACTIVE

## 2024-02-27 MED ORDER — NICOTINE 14 MG/24HR TD PT24: 14.0000 mg | MEDICATED_PATCH | Freq: Every day | TRANSDERMAL | 0 refills | Status: AC

## 2024-02-27 MED ORDER — MAGNESIUM SULFATE 2 GM/50ML IV SOLN
2.0000 g | Freq: Once | INTRAVENOUS | Status: AC
  Administered 2024-02-27: 2 g via INTRAVENOUS
  Filled 2024-02-27: qty 50

## 2024-02-27 MED ORDER — CLOPIDOGREL BISULFATE 75 MG PO TABS: 75.0000 mg | ORAL_TABLET | Freq: Every day | ORAL | 0 refills | Status: AC

## 2024-02-27 MED ORDER — ASPIRIN 81 MG PO TBEC: 81.0000 mg | DELAYED_RELEASE_TABLET | Freq: Every day | ORAL | 3 refills | Status: AC

## 2024-02-27 NOTE — Evaluation (Signed)
 Physical Therapy Evaluation Patient Details Name: Laurie Contreras MRN: [id number] DOB: 08-29-1955 Today's Date: 02/27/2024  History of Present Illness  68 y.o. female presents to [facility] 02/2424 with speech finding difficulty, h/a, and a fall to the ground. 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 PMHx: HTN, migraines with aura, OA  Clinical Impression  Pt supine on stretcher upon arrival with family present and agreeable to PT eval. PTA pt was independent for mobility with no AD. Pt presents at functional mobility baseline with ability to ambulate 283ft independently with no AD. Pt scored a 21/24 on the DGI indicating that pt is not at an increased risk of falling. Pt has intermittent assist available upon d/c home and reports feeling comfortable discharging whenever medically stable. Pt has no further questions/concerns or PT needs. Acute PT signing off. Please re-consult is new needs arise.   Supine BP- 103/92 (97)      If plan is discharge home, recommend the following: Assist for transportation   Can travel by private vehicle    Yes    Equipment Recommendations None recommended by PT     Functional Status Assessment Patient has not had a recent decline in their functional status     Precautions / Restrictions Precautions Precautions: Fall Recall of Precautions/Restrictions: Intact Precaution/Restrictions Comments: permissive HTn x48 hrs Restrictions Weight Bearing Restrictions Per Provider Order: No      Mobility  Bed Mobility Overal bed mobility: Independent     Transfers Overall transfer level: Independent Equipment used: None     Ambulation/Gait Ambulation/Gait assistance: Independent Gait Distance (Feet): 200 Feet Assistive device: None Gait Pattern/deviations: WFL(Within Functional Limits)       General Gait Details: slightly slowed gait speed, pt reports is baseline  Modified Rankin (Stroke Patients  Only) Modified Rankin (Stroke Patients Only) Pre-Morbid Rankin Score: No symptoms Modified Rankin: No symptoms     Balance Overall balance assessment: Independent    Standardized Balance Assessment Standardized Balance Assessment : Dynamic Gait Index   Dynamic Gait Index Level Surface: Mild Impairment Change in Gait Speed: Mild Impairment Gait with Horizontal Head Turns: Normal Gait with Vertical Head Turns: Normal Gait and Pivot Turn: Normal Step Over Obstacle: Normal Step Around Obstacles: Normal Steps: Mild Impairment Total Score: 21       Pertinent Vitals/Pain Pain Assessment Pain Assessment: No/denies pain    Home Living Family/patient expects to be discharged to:: Private residence Living Arrangements: Children (daughter) Available Help at Discharge: Family;Available PRN/intermittently Type of Home: House Home Access: Level entry       Home Layout: One level Home Equipment: None      Prior Function Prior Level of Function : Independent/Modified Independent;Working/employed    Mobility Comments: Ind with no AD, works at [employer] ADLs Comments: has rides from [employer] or daughter     Extremity/Trunk Assessment   Upper Extremity Assessment Upper Extremity Assessment: Defer to OT evaluation    Lower Extremity Assessment Lower Extremity Assessment: Overall WFL for tasks assessed    Cervical / Trunk Assessment Cervical / Trunk Assessment: Normal  Communication   Communication Communication: No apparent difficulties    Cognition Arousal: Alert Behavior During Therapy: WFL for tasks assessed/performed   PT - Cognitive impairments: No apparent impairments    Following commands: Intact       Cueing Cueing Techniques: Verbal cues      PT Assessment Patient does not need any further PT services         PT  Goals (Current goals can be found in the Care Plan section)  Acute Rehab PT Goals PT Goal Formulation: All assessment and education  complete, DC therapy     AM-PAC PT 6 Clicks Mobility  Outcome Measure Help needed turning from your back to your side while in a flat bed without using bedrails?: None Help needed moving from lying on your back to sitting on the side of a flat bed without using bedrails?: None Help needed moving to and from a bed to a chair (including a wheelchair)?: None Help needed standing up from a chair using your arms (e.g., wheelchair or bedside chair)?: None Help needed to walk in hospital room?: None Help needed climbing 3-5 steps with a railing? : None 6 Click Score: 24    End of Session   Activity Tolerance: Patient tolerated treatment well Patient left: in bed;with call bell/phone within reach;with family/visitor present Nurse Communication: Mobility status PT Visit Diagnosis: Other abnormalities of gait and mobility (R26.89)    Time: 0811-0825 PT Time Calculation (min) (ACUTE ONLY): 14 min   Charges:   PT Evaluation $PT Eval Low Complexity: 1 Low   PT General Charges $$ ACUTE PT VISIT: 1 Visit        Kate ORN, PT, DPT Secure Chat Preferred  Rehab Office [phone]   [name] 02/27/2024, 8:29 AM

## 2024-02-27 NOTE — ED Notes (Addendum)
 Pt done with echo and ambulating to restroom now

## 2024-02-27 NOTE — Evaluation (Signed)
 Occupational Therapy Evaluation/Discharge Patient Details Name: Laurie Contreras MRN: [id number] DOB: 1956/04/08 Today's Date: 02/27/2024   History of Present Illness   68 y.o. female presents to [facility] 02/2424 with speech finding difficulty, h/a, and a fall to the ground. 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 PMHx: HTN, migraines with aura, OA     Clinical Impressions PTA, pt lives with daughter, typically completely independent and works at [employer]. Pt presents now at baseline for ADLs/mobility and reports headache resolved. BUE strength, coordination and sensation intact. No new visual issues noted. Discussed CVA signs/symptoms and pt reports no concerns w/ mgmt of daily routine. No further skilled OT services needed at this time. Pt functionally appropriate for DC home once medically cleared.     If plan is discharge home, recommend the following:         Functional Status Assessment   Patient has not had a recent decline in their functional status     Equipment Recommendations   None recommended by OT     Recommendations for Other Services         Precautions/Restrictions   Precautions Precautions: Fall Recall of Precautions/Restrictions: Intact Precaution/Restrictions Comments: permissive HTn x48 hrs Restrictions Weight Bearing Restrictions Per Provider Order: No     Mobility Bed Mobility Overal bed mobility: Independent                  Transfers Overall transfer level: Independent Equipment used: None                      Balance                                           ADL either performed or assessed with clinical judgement   ADL Overall ADL's : Independent                                       General ADL Comments: able to sponge bathe and brush teeth standing at sink, as well as mobilize in room w/o issues. Discussed stroke signs/symptoms      Vision Baseline Vision/History: 1 Wears glasses (awaiting glasses delivery) Ability to See in Adequate Light: 1 Impaired Patient Visual Report: No change from baseline Vision Assessment?: No apparent visual deficits Additional Comments: blurriness with distance vision baseline- awaiting new glasses delivery. no field cut, no double vision     Perception         Praxis         Pertinent Vitals/Pain Pain Assessment Pain Assessment: No/denies pain     Extremity/Trunk Assessment Upper Extremity Assessment Upper Extremity Assessment: Overall WFL for tasks assessed;Right hand dominant   Lower Extremity Assessment Lower Extremity Assessment: Defer to PT evaluation   Cervical / Trunk Assessment Cervical / Trunk Assessment: Normal   Communication Communication Communication: No apparent difficulties   Cognition Arousal: Alert Behavior During Therapy: WFL for tasks assessed/performed Cognition: No apparent impairments                               Following commands: Intact       Cueing  General Comments   Cueing Techniques: Verbal cues  Daughter and goddaughter at bedside  Exercises     Shoulder Instructions      Home Living Family/patient expects to be discharged to:: Private residence Living Arrangements: Children (daughter) Available Help at Discharge: Family;Available PRN/intermittently Type of Home: House Home Access: Level entry     Home Layout: One level     Bathroom Shower/Tub: Chief Strategy Officer: Standard     Home Equipment: None          Prior Functioning/Environment Prior Level of Function : Independent/Modified Independent;Working/employed             Mobility Comments: Ind with no AD, works at [employer] ADLs Comments: has rides from [place] or daughter. Independent with all ADLs, IADLs, enjoys singing in church    OT Problem List:     OT Treatment/Interventions:        OT Goals(Current  goals can be found in the care plan section)   Acute Rehab OT Goals Patient Stated Goal: resolve issues, return home soon OT Goal Formulation: All assessment and education complete, DC therapy   OT Frequency:       Co-evaluation              AM-PAC OT 6 Clicks Daily Activity     Outcome Measure Help from another person eating meals?: None Help from another person taking care of personal grooming?: None Help from another person toileting, which includes using toliet, bedpan, or urinal?: None Help from another person bathing (including washing, rinsing, drying)?: None Help from another person to put on and taking off regular upper body clothing?: None Help from another person to put on and taking off regular lower body clothing?: None 6 Click Score: 24   End of Session    Activity Tolerance: Patient tolerated treatment well Patient left: in bed;with call bell/phone within reach;with family/visitor present  OT Visit Diagnosis: Pain Pain - part of body:  (headache)                Time: 9177-9156 OT Time Calculation (min): 21 min Charges:  OT General Charges $OT Visit: 1 Visit OT Evaluation $OT Eval Low Complexity: 1 Low  Mliss NOVAK, OTR/L Acute Rehab Services Office: [phone]   Mliss Fish 02/27/2024, 9:50 AM

## 2024-02-27 NOTE — Discharge Summary (Cosign Needed)
 Name: Laurie Contreras MRN: [id number] DOB: 07/14/1955 68 y.o. PCP: Laurie Dayton BROCKS, DO  Date of Admission: 02/26/2024  4:47 PM Date of Discharge: 02/27/2024 02/27/2024 Attending Physician: Dr. 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   diphenhydrAMINE  25 mg capsule Wait to take this until your doctor or other care provider tells you to start again. Commonly known as: BENADRYL  Take 2 capsules (50 mg total) by mouth every 6 (six) hours as needed for allergies.       TAKE these medications    aspirin  EC 81 MG tablet Take 1 tablet (81 mg total) by mouth daily. Swallow whole. Start taking on: February 28, 2024   atorvastatin  40 MG tablet Commonly known as: LIPITOR Take 1 tablet (40 mg total) by mouth daily.   clopidogrel  75 MG tablet Commonly known as: PLAVIX  Take 1 tablet (75 mg total) by mouth daily for 21 days. Start taking on: February 28, 2024   multivitamin with minerals Tabs tablet Take 1 tablet by mouth daily.   nicotine  14 mg/24hr patch Commonly known as: NICODERM CQ  - dosed in mg/24 hours Place 1 patch (14 mg total) onto the skin daily. Start taking on: February 28, 2024   omeprazole  20 MG tablet Commonly known as: PRILOSEC OTC Take 20 mg by mouth daily as needed.   Varenicline  Tartrate (Starter) 0.5 MG X 11 & 1 MG X 42 Tbpk Commonly known as: Chantix   Starting Month Pak Take 0.5 mg by mouth daily for 3 days, THEN 0.5 mg 2 (two) times daily for 4 days, THEN 1 mg 2 (two) times daily. Start taking on: December 30, 2023        DISPOSITION AND FOLLOW-UP:  [name] was discharged from [facility] in Stable condition. At the hospital follow up visit please address:  Follow-up Recommendations:  Ischemic Left Frontal Infarct Ensure patient is compliant with Plavix  and Aspirin  and has had no further symptoms/deficits.   HTN Ensure patient has resumed Lotrel as of 02/29/2024.  HFmrEF Incidental finding on TTE from stroke workup. EF 45-50%. See hospital summary below for more information, but consider further discussion/initiation of SGLT2-I. She denies history of UTI's.   Tobacco Use Disorder Continue counseling on the importance of cessation. Consider screening low-dose CT Lung scan.  HOSPITAL COURSE:  Patient Summary: [name] is a 68 y.o. person living with a history of HTN and migraines with aura who presents to [facility] ED with speech finding difficulty and headache and admitted for stroke work up.  ##Ischemic Left Frontal Infarct #Aphasia #Headache Patient presented to ED with complaints of aphasia and headache. She  described being at lunch with coworkers when she suddenly experienced aphasia and severe headache. She was admitted to [facility] ED for a Code Stroke. It was when she arrived to [facility] ED that she returned to her  normal baseline. CT Head showed no acute intracranial abnormality, and progressive periventricular white matter disease bilaterally. CT Angio Head/Neck showed no large vessel occlusion or flow limiting proximal intracranial arterial stenosis, and moderate stenosis versus artifact at the right vertebral artery origin. LDL appropriate at 55. A1C from 12/30/2023 was 6.2. Neurology was then consulted, who felt that symptoms were likely due to migraine with aura and Functional Neurological Disorder. MRI  Brain was obtained, showing small acute ischemic nonhemorrhagic subcortical left frontal lobe infarct, and underlying moderate chronic microvascular ischemic disease with a few scattered remote lacunar infarcts about the deep gray nuclei and pons. She was started on DAPT with Plavix  and ASA. A 2D Echo was ordered, which was negative for thrombus, but showed reduced EF of 45-50%. See below. During assessment by IMTS, patient reported she was at baseline, appropriately answering questions and providing history of the day. Global headache had improved from 10 earlier in the day to 6/10. Physical exam was mostly benign, showing no focal deficits, however,  after V1 testing, patient immediately recoiled from light touch to her forehead, endorsing intense headache and then suddenly questioned who the people in the room were (the night IMTS admitting team and patient's daughter), she began to have aphasia but could write out her name when asked. She expressed agitation alongside her headache and Broca's aphasia. Very shortly after, the patient returned to her baseline, with no residual aphasia or headache. Unclear if sudden change aligned with infarcts or FND as stated by neurology. For day IMTS team, patient remained pleasant and at her baseline throughout the duration of the evaluation without any aphasia or headache. PT/OT were consulted for any residual, latent deficits, of which there were none. Patient was discharged on DAPT therapy, instructed to complete 21 days of Plavix  and ASA, followed by ASA alone.    #HTN In the setting of ischemic left frontal infarct and stroke work up, allowing for permissive hypertension as above so will hold home Amlodipine -benazepril  10-20 mg daily. Has last dose was 9/24 AM and she reports complete adherence to this medication. Held home Amlodipine -benazepril  10-20 mg daily for permissive hypertension. May resume 2 days from discharge on 9/27.  #HFmrEF Found incidentally from  TTE on stroke work-up. EF = 45-50% and did also note global hypokinesis of LV. She denies history of SOB/orthopnea. Occasionally has LE swelling but this has been ongoing for many years (perhaps from Amlodipine  component of Lotrel). TTE in 2022 with normal EF. I discussed starting SGLT2-I but patient/daughter would like to think and discuss this further at [facility] follow-up.  #Elevated Cr Cr 1.04, eGFR 59 on admission. Baseline appears to be closer to 0.8. This may be in the setting of dehydration as patient had not eaten or drank since this morning as lunch was interrupted beforehand. Improved on repeat BMP to 0.77.    #Hypokalemia, resolved Patient with borderline low potassium of 3.4 on admission. Replenished with Kcl 40mEq once. Repeat K+ 4.0.     #Tobacco Use Disorder Patient endorses 0.5 PPD smoking. She has been trying to quit and was prescribed Chantix , but had not started it yet. She would like a nicotine  patch, of which was started daily.   #HLD  Patient takes Atorvastatin  40 mg daily which she reports complete adherence to. Lipid panel today showed LDL 55, HDL 34, cholesterol 107, triglycerides 88, VLDL 18. Given lipids are appropriate and patient is already on high intensity statin dosing, continued home Atorvastatin  40 mg.   #GERD Continue Pantoprazole   40 mg daily    DISCHARGE INSTRUCTIONS:   Discharge Instructions     Diet - low sodium heart healthy   Complete by: As directed    Increase activity slowly   Complete by: As directed        SUBJECTIVE:  Patient is evaluated with her daughter present at bedside. She is resting comfortably in bed in no acute distress. She states her headache is gone and she feels no residual weakness compared to how she felt the day before. She is able to recount the events leading up to her admission correctly and accurately with no difficulties in speech. Denies SOB, chest pain, headache, or pain. She is amenable to discharge today with close  follow up.  Discharge Vitals:   BP 128/71 (BP Location: Right Arm)   Pulse 75   Temp 98.4 F (36.9 C) (Oral)   Resp 18   Ht 5' 7 (1.702 m)   Wt 75.8 kg   SpO2 98%   BMI 26.16 kg/m   OBJECTIVE:  Physical Exam Constitutional:      Appearance: Normal appearance.  HENT:     Head: Normocephalic and atraumatic.     Mouth/Throat:     Mouth: Mucous membranes are moist.  Eyes:     Extraocular Movements: Extraocular movements intact.     Pupils: Pupils are equal, round, and reactive to light.  Cardiovascular:     Rate and Rhythm: Normal rate and regular rhythm.     Pulses: Normal pulses.     Heart sounds: Normal heart sounds.  Pulmonary:     Effort: Pulmonary effort is normal.     Breath sounds: Normal breath sounds.  Abdominal:     General: Abdomen is flat. Bowel sounds are normal.     Palpations: Abdomen is soft.  Musculoskeletal:        General: Normal range of motion.     Cervical back: Normal range of motion.     Comments: 5/5 strength of bilateral UE and LE.  Skin:    General: Skin is warm and dry.  Neurological:     General: No focal deficit present.     Mental Status: She is alert and oriented to person, place, and time.     Cranial Nerves: No cranial nerve deficit.     Sensory: No sensory deficit.     Motor: No weakness.     Coordination: Coordination normal.     Gait: Gait normal.  Psychiatric:        Mood and Affect: Mood normal.        Behavior: Behavior normal.      Pertinent Labs, Studies, and Procedures:     Latest Ref Rng & Units 02/27/2024    4:51 AM 02/26/2024    4:54 PM 02/26/2024    4:49 PM  CBC  WBC 4.0 - 10.5 K/uL 4.6   5.7   Hemoglobin 12.0 - 15.0 g/dL 88.2  87.7  87.7   Hematocrit 36.0 - 46.0 % 34.5  36.0  35.5   Platelets 150 - 400 K/uL 214   240        Latest Ref Rng & Units 02/27/2024    4:51 AM 02/26/2024    4:54 PM 02/26/2024    4:49 PM  CMP  Glucose 70 - 99 mg/dL 892  897  894   BUN 8 - 23 mg/dL 7  10  10    Creatinine 0.44 -  1.00 mg/dL 9.22  8.79  8.95  Sodium 135 - 145 mmol/L 139  143  139   Potassium 3.5 - 5.1 mmol/L 4.0  3.5  3.4   Chloride 98 - 111 mmol/L 109  104  104   CO2 22 - 32 mmol/L 21   25   Calcium  8.9 - 10.3 mg/dL 89.8   89.4   Total Protein 6.5 - 8.1 g/dL   8.3   Total Bilirubin 0.0 - 1.2 mg/dL   0.9   Alkaline Phos 38 - 126 U/L   98   AST 15 - 41 U/L   26   ALT 0 - 44 U/L   18     ECHOCARDIOGRAM COMPLETE Result Date: 02/27/2024    ECHOCARDIOGRAM REPORT   Patient Name:   ZARYIA MARKEL Date of Exam: 02/27/2024 Medical Rec #:  [id number]       Height:       67.0 in Accession #:    [id number]      Weight:       167.0 lb Date of Birth:  [birth year]       BSA:          1.873 m Patient Age:    67 years        BP:           119/50 mmHg Patient Gender: F               HR:           70 bpm. Exam Location:  Inpatient Procedure: 2D Echo and Strain Analysis (Both Spectral and Color Flow Doppler            were utilized during procedure). Indications:    Stroke  History:        Patient has prior history of Echocardiogram examinations. Risk                 Factors:Hypertension.  Sonographer:    Charmaine Gaskins Referring Phys: [id number] [facility] KHALIQDINA IMPRESSIONS  1. Left ventricular ejection fraction, by estimation, is 45 to 50%. Left ventricular ejection fraction by 2D MOD biplane is 51.4 %. The left ventricle has mildly decreased function. The left ventricle demonstrates global hypokinesis. Left ventricular diastolic parameters are consistent with Grade I diastolic dysfunction (impaired relaxation). The average left ventricular global longitudinal strain is -15.3 %. The global longitudinal strain is abnormal.  2. Right ventricular systolic function is normal. The right ventricular size is normal. Tricuspid regurgitation signal is inadequate for assessing PA pressure.  3. The mitral valve is normal in structure. No evidence of mitral valve regurgitation. No evidence of mitral stenosis.  4. The aortic valve is tricuspid.  Aortic valve regurgitation is not visualized. No aortic stenosis is present.  5. The inferior vena cava is normal in size with greater than 50% respiratory variability, suggesting right atrial pressure of 3 mmHg. FINDINGS  Left Ventricle: Left ventricular ejection fraction, by estimation, is 45 to 50%. Left ventricular ejection fraction by 2D MOD biplane is 51.4 %. The left ventricle has mildly decreased function. The left ventricle demonstrates global hypokinesis. The average left ventricular global longitudinal strain is -15.3 %. Strain was performed and the global longitudinal strain is abnormal. The left ventricular internal cavity size was normal in size. There is no left ventricular hypertrophy. Left ventricular diastolic parameters are consistent with Grade I diastolic dysfunction (impaired relaxation). Right Ventricle: The right ventricular size is normal. No increase in right ventricular wall thickness. Right ventricular systolic function is normal. Tricuspid regurgitation signal  is inadequate for assessing PA pressure. Left Atrium: Left atrial size was normal in size. Right Atrium: Right atrial size was normal in size. Pericardium: There is no evidence of pericardial effusion. Mitral Valve: The mitral valve is normal in structure. No evidence of mitral valve regurgitation. No evidence of mitral valve stenosis. Tricuspid Valve: The tricuspid valve is normal in structure. Tricuspid valve regurgitation is not demonstrated. Aortic Valve: The aortic valve is tricuspid. Aortic valve regurgitation is not visualized. No aortic stenosis is present. Pulmonic Valve: The pulmonic valve was normal in structure. Pulmonic valve regurgitation is trivial. Aorta: The aortic root is normal in size and structure. Venous: The inferior vena cava is normal in size with greater than 50% respiratory variability, suggesting right atrial pressure of 3 mmHg. IAS/Shunts: No atrial level shunt detected by color flow Doppler.  LEFT  VENTRICLE PLAX 2D                        Biplane EF (MOD) LVIDd:         5.20 cm         LV Biplane EF:   Left LVIDs:         3.60 cm                          ventricular LV PW:         0.90 cm                          ejection LV IVS:        1.00 cm                          fraction by LVOT diam:     2.20 cm                          2D MOD LVOT Area:     3.80 cm                         biplane is                                                 51.4 %.  LV Volumes (MOD)               Diastology LV vol d, MOD    76.6 ml       LV e' medial:    4.13 cm/s A2C:                           LV E/e' medial:  13.8 LV vol d, MOD    79.7 ml       LV e' lateral:   6.42 cm/s A4C:                           LV E/e' lateral: 8.9 LV vol s, MOD    38.7 ml A2C:                           2D Longitudinal LV vol s, MOD  36.4 ml       Strain A4C:                           2D Strain GLS   -15.3 % LV SV MOD A2C:   37.9 ml       Avg: LV SV MOD A4C:   79.7 ml LV SV MOD BP:    40.6 ml RIGHT VENTRICLE RV Basal diam:  3.30 cm RV Mid diam:    3.00 cm RV S prime:     16.40 cm/s LEFT ATRIUM             Index        RIGHT ATRIUM           Index LA diam:        2.80 cm 1.49 cm/m   RA Area:     17.80 cm LA Vol (A2C):   58.2 ml 31.07 ml/m  RA Volume:   46.80 ml  24.99 ml/m LA Vol (A4C):   58.7 ml 31.34 ml/m LA Biplane Vol: 59.5 ml 31.77 ml/m   AORTA Ao Asc diam: 3.10 cm MITRAL VALVE MV Area (PHT): 5.13 cm    SHUNTS MV Decel Time: 148 msec    Systemic Diam: 2.20 cm MV E velocity: 57.10 cm/s MV A velocity: 90.70 cm/s MV E/A ratio:  0.63 Dalton McleanMD Electronically signed by Ezra Kanner Signature Date/Time: 02/27/2024/4:20:01 PM    Final    MR BRAIN WO CONTRAST Result Date: 02/26/2024 CLINICAL DATA:  Follow-up examination for acute stroke. EXAM: MRI HEAD WITHOUT CONTRAST TECHNIQUE: Multiplanar, multiecho pulse sequences of the brain and surrounding structures were obtained without intravenous contrast. COMPARISON:  Prior CTs from earlier  the same day. FINDINGS: Brain: Cerebral volume within normal limits. Patchy and confluent T2/FLAIR hyperintensity involving the periventricular deep white matter both cerebral hemispheres as well as the pons, consistent with chronic small vessel ischemic disease, moderate in nature. Few scattered superimposed remote lacunar infarcts noted about the deep gray nuclei and pons. Small focus of serpiginous diffusion signal abnormality seen involving the subcortical left frontal lobe, likely reflecting a small acute ischemic infarct (series 2, images 34, 32). Subtle associated signal loss on ADC map. No associated hemorrhage or mass effect. No other evidence for acute or subacute ischemia. Gray-white matter differentiation otherwise maintained. No acute or chronic intracranial blood products. No mass lesion, midline shift or mass effect. No hydrocephalus or extra-axial fluid collection. Pituitary gland within normal limits. Vascular: Major intracranial vascular flow voids are maintained. Skull and upper cervical spine: Craniocervical junction within normal limits. Bone marrow signal intensity overall within normal limits. No scalp soft tissue abnormality. Sinuses/Orbits: Globes and orbital soft tissues within normal limits. Mild scattered mucosal thickening present about the ethmoidal air cells. Paranasal sinuses are otherwise largely clear. No significant mastoid effusion. Other: None. IMPRESSION: 1. Small acute ischemic nonhemorrhagic subcortical left frontal lobe infarct. 2. Underlying moderate chronic microvascular ischemic disease with a few scattered remote lacunar infarcts about the deep gray nuclei and pons. Electronically Signed   By: Morene Hoard M.D.   On: 02/26/2024 19:39   CT ANGIO HEAD NECK W WO CM (CODE STROKE) Result Date: 02/26/2024 CLINICAL DATA:  Neuro deficit, acute, stroke suspected. EXAM: CT ANGIOGRAPHY HEAD AND NECK WITH AND WITHOUT CONTRAST TECHNIQUE: Multidetector CT imaging of the head  and neck was performed using the standard protocol during bolus administration of intravenous contrast. Multiplanar CT image reconstructions and MIPs were  obtained to evaluate the vascular anatomy. Carotid stenosis measurements (when applicable) are obtained utilizing NASCET criteria, using the distal internal carotid diameter as the denominator. RADIATION DOSE REDUCTION: This exam was performed according to the departmental dose-optimization program which includes automated exposure control, adjustment of the mA and/or kV according to patient size and/or use of iterative reconstruction technique. CONTRAST:  75mL OMNIPAQUE  IOHEXOL  350 MG/ML SOLN COMPARISON:  None Available. FINDINGS: CTA NECK FINDINGS Aortic arch: Normal variant aortic arch branching pattern with common origin of the brachiocephalic and left common carotid arteries. Mild atherosclerosis without a significant stenosis of the arch vessel origins. Soft plaque in the proximal left subclavian artery resulting in less than 50% stenosis. Right carotid system: Patent with predominantly calcified plaque at the carotid bifurcation. No evidence of a significant stenosis or dissection. Left carotid system: Patent with minimal calcified plaque at the carotid bifurcation. No evidence of a significant stenosis or dissection. Vertebral arteries: Patent with the right being mildly dominant. Moderate stenosis versus artifact from streak and motion at the right vertebral origin. No evidence of a significant stenosis or dissection elsewhere. Skeleton: Mild cervical disc degeneration. Advanced right facet arthrosis at C2-3 and C3-4. Other neck: No evidence of cervical lymphadenopathy or mass. 8 mm left thyroid nodule for which no follow-up imaging is recommended. Upper chest: Centrilobular emphysema. Review of the MIP images confirms the above findings CTA HEAD FINDINGS Anterior circulation: The internal carotid arteries are patent from skull base to carotid termini  with mild atherosclerotic irregularity but no significant stenosis. ACAs and MCAs are patent without evidence of a proximal branch occlusion or significant proximal stenosis. No aneurysm is identified. Posterior circulation: The intracranial vertebral arteries are patent to the basilar. Patent right PICA, left AICA, and bilateral SCA origins are visualized. The basilar artery is patent with irregularity in up to mild narrowing proximally. There is a patent right posterior communicating artery. Both PCAs are patent without evidence of a significant proximal stenosis. No aneurysm is identified. Venous sinuses: Patent. Anatomic variants: None. Review of the MIP images confirms the above findings These results were communicated to Dr. Michaela at 5:07 pm on 02/26/2024 by text page via the [facility] messaging system. IMPRESSION: 1. No large vessel occlusion or flow limiting proximal intracranial arterial stenosis. 2. Moderate stenosis versus artifact at the right vertebral artery origin. 3. Cervical carotid atherosclerosis without stenosis. 4. Aortic Atherosclerosis (ICD10-I70.0) and Emphysema (ICD10-J43.9). Electronically Signed   By: Dasie Hamburg M.D.   On: 02/26/2024 17:15   CT HEAD CODE STROKE WO CONTRAST Result Date: 02/26/2024 EXAM: CT HEAD WITHOUT CONTRAST 02/26/2024 04:56:00 PM TECHNIQUE: CT of the head was performed without the administration of intravenous contrast. Automated exposure control, iterative reconstruction, and/or weight based adjustment of the mA/kV was utilized to reduce the radiation dose to as low as reasonably achievable. COMPARISON: CT head without contrast 08/24/2014. CLINICAL HISTORY: Neuro deficit, acute, stroke suspected. Code Stroke. FINDINGS: BRAIN AND VENTRICLES: No acute hemorrhage. No evidence of acute infarct. No hydrocephalus. No extra-axial collection. No mass effect or midline shift. Progressive periventricular white matter disease is present bilaterally. ORBITS: No acute  abnormality. SINUSES: No acute abnormality. SOFT TISSUES AND SKULL: No acute soft tissue abnormality. No skull fracture. [place] stroke program early CT (ASPECTS) score: Ganglionic (caudate, internal capsule, lentiform nucleus, insula, M1-M3): 7 Supraganglionic (M4-M6): 3 Total: 10 The pertinent results were texted to Dr. Michaela via the [facility] system at 5:00 PM. IMPRESSION: 1. No acute intracranial abnormality. 2. Progressive periventricular white matter disease bilaterally.  Electronically signed by: Lonni Necessary MD 02/26/2024 05:01 PM EDT RP Workstation: [id number]

## 2024-02-27 NOTE — Progress Notes (Addendum)
 STROKE TEAM PROGRESS NOTE   INTERIM HISTORY/SUBJECTIVE Was at lunch yesterday and then noted difficulty speaking when she went back to work. Symptoms have improved. PT/OT signed off. Echo pending. Resume home medications and start DAPT for 21 days and then ASA alone.  MRI scan of the brain shows a left subcortical corona radiator lacunar infarct.  CT angiogram showed no large vessel stenosis or occlusion. OBJECTIVE  CBC    Component Value Date/Time   WBC 4.6 02/27/2024 0451   RBC 4.05 02/27/2024 0451   HGB 11.7 (L) 02/27/2024 0451   HGB 12.4 12/30/2023 0948   HCT 34.5 (L) 02/27/2024 0451   HCT 37.0 12/30/2023 0948   PLT 214 02/27/2024 0451   PLT 255 12/30/2023 0948   MCV 85.2 02/27/2024 0451   MCV 86 12/30/2023 0948   MCH 28.9 02/27/2024 0451   MCHC 33.9 02/27/2024 0451   RDW 15.3 02/27/2024 0451   RDW 14.8 12/30/2023 0948   LYMPHSABS 2.8 02/26/2024 1649   LYMPHSABS 2.5 12/30/2023 0948   MONOABS 0.4 02/26/2024 1649   EOSABS 0.0 02/26/2024 1649   EOSABS 0.0 12/30/2023 0948   BASOSABS 0.0 02/26/2024 1649   BASOSABS 0.0 12/30/2023 0948    BMET    Component Value Date/Time   NA 139 02/27/2024 0451   NA 141 12/30/2023 0948   K 4.0 02/27/2024 0451   CL 109 02/27/2024 0451   CO2 21 (L) 02/27/2024 0451   GLUCOSE 107 (H) 02/27/2024 0451   BUN 7 (L) 02/27/2024 0451   BUN 10 12/30/2023 0948   CREATININE 0.77 02/27/2024 0451   CREATININE 0.56 09/08/2014 1055   CALCIUM  10.1 02/27/2024 0451   EGFR 78 12/30/2023 0948   GFRNONAA >60 02/27/2024 0451   GFRNONAA >89 09/08/2014 1055    IMAGING past 24 hours MR BRAIN WO CONTRAST Result Date: 02/26/2024 CLINICAL DATA:  Follow-up examination for acute stroke. EXAM: MRI HEAD WITHOUT CONTRAST TECHNIQUE: Multiplanar, multiecho pulse sequences of the brain and surrounding structures were obtained without intravenous contrast. COMPARISON:  Prior CTs from earlier the same day. FINDINGS: Brain: Cerebral volume within normal limits. Patchy and  confluent T2/FLAIR hyperintensity involving the periventricular deep white matter both cerebral hemispheres as well as the pons, consistent with chronic small vessel ischemic disease, moderate in nature. Few scattered superimposed remote lacunar infarcts noted about the deep gray nuclei and pons. Small focus of serpiginous diffusion signal abnormality seen involving the subcortical left frontal lobe, likely reflecting a small acute ischemic infarct (series 2, images 34, 32). Subtle associated signal loss on ADC map. No associated hemorrhage or mass effect. No other evidence for acute or subacute ischemia. Gray-white matter differentiation otherwise maintained. No acute or chronic intracranial blood products. No mass lesion, midline shift or mass effect. No hydrocephalus or extra-axial fluid collection. Pituitary gland within normal limits. Vascular: Major intracranial vascular flow voids are maintained. Skull and upper cervical spine: Craniocervical junction within normal limits. Bone marrow signal intensity overall within normal limits. No scalp soft tissue abnormality. Sinuses/Orbits: Globes and orbital soft tissues within normal limits. Mild scattered mucosal thickening present about the ethmoidal air cells. Paranasal sinuses are otherwise largely clear. No significant mastoid effusion. Other: None. IMPRESSION: 1. Small acute ischemic nonhemorrhagic subcortical left frontal lobe infarct. 2. Underlying moderate chronic microvascular ischemic disease with a few scattered remote lacunar infarcts about the deep gray nuclei and pons. Electronically Signed   By: Morene Hoard M.D.   On: 02/26/2024 19:39   CT ANGIO HEAD NECK W WO CM (  CODE STROKE) Result Date: 02/26/2024 CLINICAL DATA:  Neuro deficit, acute, stroke suspected. EXAM: CT ANGIOGRAPHY HEAD AND NECK WITH AND WITHOUT CONTRAST TECHNIQUE: Multidetector CT imaging of the head and neck was performed using the standard protocol during bolus administration  of intravenous contrast. Multiplanar CT image reconstructions and MIPs were obtained to evaluate the vascular anatomy. Carotid stenosis measurements (when applicable) are obtained utilizing NASCET criteria, using the distal internal carotid diameter as the denominator. RADIATION DOSE REDUCTION: This exam was performed according to the departmental dose-optimization program which includes automated exposure control, adjustment of the mA and/or kV according to patient size and/or use of iterative reconstruction technique. CONTRAST:  75mL OMNIPAQUE  IOHEXOL  350 MG/ML SOLN COMPARISON:  None Available. FINDINGS: CTA NECK FINDINGS Aortic arch: Normal variant aortic arch branching pattern with common origin of the brachiocephalic and left common carotid arteries. Mild atherosclerosis without a significant stenosis of the arch vessel origins. Soft plaque in the proximal left subclavian artery resulting in less than 50% stenosis. Right carotid system: Patent with predominantly calcified plaque at the carotid bifurcation. No evidence of a significant stenosis or dissection. Left carotid system: Patent with minimal calcified plaque at the carotid bifurcation. No evidence of a significant stenosis or dissection. Vertebral arteries: Patent with the right being mildly dominant. Moderate stenosis versus artifact from streak and motion at the right vertebral origin. No evidence of a significant stenosis or dissection elsewhere. Skeleton: Mild cervical disc degeneration. Advanced right facet arthrosis at C2-3 and C3-4. Other neck: No evidence of cervical lymphadenopathy or mass. 8 mm left thyroid nodule for which no follow-up imaging is recommended. Upper chest: Centrilobular emphysema. Review of the MIP images confirms the above findings CTA HEAD FINDINGS Anterior circulation: The internal carotid arteries are patent from skull base to carotid termini with mild atherosclerotic irregularity but no significant stenosis. ACAs and MCAs  are patent without evidence of a proximal branch occlusion or significant proximal stenosis. No aneurysm is identified. Posterior circulation: The intracranial vertebral arteries are patent to the basilar. Patent right PICA, left AICA, and bilateral SCA origins are visualized. The basilar artery is patent with irregularity in up to mild narrowing proximally. There is a patent right posterior communicating artery. Both PCAs are patent without evidence of a significant proximal stenosis. No aneurysm is identified. Venous sinuses: Patent. Anatomic variants: None. Review of the MIP images confirms the above findings These results were communicated to Dr. Michaela at 5:07 pm on 02/26/2024 by text page via the [facility] messaging system. IMPRESSION: 1. No large vessel occlusion or flow limiting proximal intracranial arterial stenosis. 2. Moderate stenosis versus artifact at the right vertebral artery origin. 3. Cervical carotid atherosclerosis without stenosis. 4. Aortic Atherosclerosis (ICD10-I70.0) and Emphysema (ICD10-J43.9). Electronically Signed   By: Dasie Hamburg M.D.   On: 02/26/2024 17:15   CT HEAD CODE STROKE WO CONTRAST Result Date: 02/26/2024 EXAM: CT HEAD WITHOUT CONTRAST 02/26/2024 04:56:00 PM TECHNIQUE: CT of the head was performed without the administration of intravenous contrast. Automated exposure control, iterative reconstruction, and/or weight based adjustment of the mA/kV was utilized to reduce the radiation dose to as low as reasonably achievable. COMPARISON: CT head without contrast 08/24/2014. CLINICAL HISTORY: Neuro deficit, acute, stroke suspected. Code Stroke. FINDINGS: BRAIN AND VENTRICLES: No acute hemorrhage. No evidence of acute infarct. No hydrocephalus. No extra-axial collection. No mass effect or midline shift. Progressive periventricular white matter disease is present bilaterally. ORBITS: No acute abnormality. SINUSES: No acute abnormality. SOFT TISSUES AND SKULL: No acute soft tissue  abnormality.  No skull fracture. [place] stroke program early CT (ASPECTS) score: Ganglionic (caudate, internal capsule, lentiform nucleus, insula, M1-M3): 7 Supraganglionic (M4-M6): 3 Total: 10 The pertinent results were texted to Dr. Michaela via the [facility] system at 5:00 PM. IMPRESSION: 1. No acute intracranial abnormality. 2. Progressive periventricular white matter disease bilaterally. Electronically signed by: Lonni Necessary MD 02/26/2024 05:01 PM EDT RP Workstation: [id number]    Vitals:   02/27/24 0300 02/27/24 0600 02/27/24 0700 02/27/24 0743  BP: 133/76 (!) 141/68 (!) 119/50 (!) 154/71  Pulse: 66 75 69 68  Resp: 18   18  Temp: 98.2 F (36.8 C)   98.7 F (37.1 C)  TempSrc: Oral   Oral  SpO2: 96% 94% 93% 99%  Weight:      Height:         PHYSICAL EXAM General:  Alert, well-nourished, well-developed patient in no acute distress Psych:  Mood and affect appropriate for situation CV: Regular rate and rhythm on monitor Respiratory:  Regular, unlabored respirations on room air GI: Abdomen soft and nontender   NEURO:  Mental Status: AA&Ox3, patient is able to give clear and coherent history Speech/Language: speech is without dysarthria or aphasia.  Naming, repetition, fluency, and comprehension intact.  Cranial Nerves:  II: PERRL. Visual fields full.  III, IV, VI: EOMI. Eyelids elevate symmetrically.  V: Sensation is intact to light touch and symmetrical to face.  VII: Face is symmetrical resting and smiling VIII: hearing intact to voice. IX, X: Palate elevates symmetrically. Phonation is normal.  KP:Dynloizm shrug 5/5. XII: tongue is midline without fasciculations. Motor: 5/5 strength to all muscle groups tested.  Tone: is normal and bulk is normal Sensation- Intact to light touch bilaterally. Extinction absent to light touch to DSS.   Coordination: FTN intact bilaterally, HKS: no ataxia in BLE.No drift.  Gait- deferred  Most Recent NIH 0      ASSESSMENT/PLAN  Ms. CORIANNE BUCCELLATO is a 68 y.o. female with history of hypertension who presents with speech difficulty that started abruptly while at work.  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 Progressive periventricular white matter disease bilaterally.  CTA head & neck No large vessel occlusion or flow limiting proximal intracranial arterial stenosis. Moderate stenosis versus artifact at the right vertebral artery origin. Cervical carotid atherosclerosis without stenosis. MRI  Small acute ischemic nonhemorrhagic subcortical left frontal lobe infarct. 2D Echo Pending  LDL 55 HgbA1c 6.0 VTE prophylaxis - Lovenox   No antithrombotic prior to admission, now on aspirin  81 mg daily and clopidogrel  75 mg daily for 3 weeks and then aspirin  alone. Therapy recommendations:  No follow up needed  Disposition:  Home after completion of workup  Hypertension Home meds:  amlodipine - benazepril  Stable Blood Pressure Goal: BP less than 220/110   Hyperlipidemia Home meds:  Atorvastatin , resumed in hospital LDL 55, goal < 70 Continue statin at discharge   Hospital day # 0  Patient seen and examined by NP/APP with MD. MD to update note as needed.   Jorene Last, DNP, FNP-BC [facility] Pager: [phone]  I have personally obtained history,examined this patient, reviewed notes, independently viewed imaging studies, participated in medical decision making and plan of care.ROS completed by me personally and pertinent positives fully documented  I have made any additions or clarifications directly to the above note. Agree with note above.  Patient presented with transient speech difficulties with  MRI scan showing left subcortical lacunar infarct likely from small vessel disease.  Continue ongoing stroke workup.  Aggressive risk factor modification.  Aspirin  and Plavix   for 3 weeks followed by aspirin  alone.  No family available at the bedside.   I personally spent a total of 50 minutes in the care of the patient today including getting/reviewing separately obtained history, performing a medically appropriate exam/evaluation, counseling and educating, placing orders, referring and communicating with other health care professionals, documenting clinical information in the EHR, independently interpreting results, and coordinating care.         Eather Popp, MD Medical Director [facility] Pager: [phone] 02/27/2024 4:11 PM   To contact Stroke Continuity provider, please refer to [email or web address]. After hours, contact General Neurology

## 2024-02-27 NOTE — ED Notes (Addendum)
 Pt ambulated to restroom with 1 person assisted. Pt ambulated with steady gait. Denies dizziness, SOB, CP while walking

## 2024-02-27 NOTE — Discharge Instructions (Addendum)
 Laurie Contreras,  You were recently admitted to [facility] for sudden weakness and speech changes, as well as your fall yesterday. Your workup showed a small acute ischemic stroke in your left front lobe, which is what caused your symptoms.   Continue taking your home medications with the following changes  Start taking:  Aspirin  81mg , daily. This medicine is an antiplatelet medication that will help prevent future strokes. Please take this once daily until you neurologist tells you it is safe to stop. Atorvastatin  (Lipitor) 40mg , daily. Clopidogrel  (Plavix ) 75mg  daily. This medicine is an antiplatelet medication that will help prevent future strokes. Please take this medicine for the next 21 days, then you will take Aspirin  alone.  Pause taking: Benadryl  50mg . At high enough doses, this can cause confusion, and we do not want this to potentially mask any symptoms of stroke/weakness if you were to have another. Amlodipine -Benazepril  (Lotrel) until 9/27 and check your blood pressure between now and then to make sure the first number is not greater than 220. If it is, call the internal medicine center. Continue taking:  Chantix  as prescribed to help you stop smoking Prilosec OTC for heartburn Multivitamin.    You should seek further medical care if you notice new weakness, any changes in your speech, or any new intense headaches that are different than your normal migraines.  We have called the clinic to set up a follow up appointment for you after you leave the hospital to make sure you are getting better. Your hospital follow-up appointment is with Dr. Rosan on 10/13 at 9:15 AM.  We are so glad that you are feeling better.  Sincerely, Jaden Amilibia, DO

## 2024-02-27 NOTE — ED Notes (Signed)
 Per admitting MDs, during their assessment, patient had an episode of new onset agitation and confusion. Patient also reported feeling an intense headache come on at the same time, but episode only lasted a few minutes and patient returned to baseline. MDs at bedside before and after this episode. Upon this RN's assessment, patient only reported the headache pain 10/10. This RN did not find agitation or confusion related to patient. Patient ambulatory to RR assisted by family. Patient quickly returned to baseline.

## 2024-03-16 ENCOUNTER — Encounter: Payer: Self-pay | Admitting: Internal Medicine

## 2024-03-16 ENCOUNTER — Ambulatory Visit: Admitting: Internal Medicine

## 2024-03-16 VITALS — BP 132/66 | HR 74 | Temp 98.1°F | Ht 67.0 in | Wt 171.2 lb

## 2024-03-16 DIAGNOSIS — K219 Gastro-esophageal reflux disease without esophagitis: Secondary | ICD-10-CM

## 2024-03-16 DIAGNOSIS — Z87891 Personal history of nicotine dependence: Secondary | ICD-10-CM

## 2024-03-16 DIAGNOSIS — Z23 Encounter for immunization: Secondary | ICD-10-CM | POA: Diagnosis not present

## 2024-03-16 DIAGNOSIS — Z79899 Other long term (current) drug therapy: Secondary | ICD-10-CM

## 2024-03-16 DIAGNOSIS — Z8673 Personal history of transient ischemic attack (TIA), and cerebral infarction without residual deficits: Secondary | ICD-10-CM

## 2024-03-16 DIAGNOSIS — G2581 Restless legs syndrome: Secondary | ICD-10-CM | POA: Diagnosis not present

## 2024-03-16 DIAGNOSIS — I10 Essential (primary) hypertension: Secondary | ICD-10-CM

## 2024-03-16 DIAGNOSIS — Z1231 Encounter for screening mammogram for malignant neoplasm of breast: Secondary | ICD-10-CM

## 2024-03-16 DIAGNOSIS — R6889 Other general symptoms and signs: Secondary | ICD-10-CM

## 2024-03-16 DIAGNOSIS — Z122 Encounter for screening for malignant neoplasm of respiratory organs: Secondary | ICD-10-CM

## 2024-03-16 DIAGNOSIS — Z823 Family history of stroke: Secondary | ICD-10-CM

## 2024-03-16 DIAGNOSIS — Z13820 Encounter for screening for osteoporosis: Secondary | ICD-10-CM

## 2024-03-16 DIAGNOSIS — Z8249 Family history of ischemic heart disease and other diseases of the circulatory system: Secondary | ICD-10-CM

## 2024-03-17 LAB — IRON,TIBC AND FERRITIN PANEL
Ferritin: 394 ng/mL — ABNORMAL HIGH (ref 15–150)
Iron Saturation: 32 % (ref 15–55)
Iron: 85 ug/dL (ref 27–139)
Total Iron Binding Capacity: 266 ug/dL (ref 250–450)
UIBC: 181 ug/dL (ref 118–369)

## 2024-03-17 LAB — TSH: TSH: 2.06 u[IU]/mL (ref 0.450–4.500)

## 2024-03-17 LAB — VITAMIN B12: Vitamin B-12: 640 pg/mL (ref 232–1245)

## 2024-03-17 LAB — MAGNESIUM: Magnesium: 1.8 mg/dL (ref 1.6–2.3)

## 2024-03-19 NOTE — Assessment & Plan Note (Signed)
 On long term PPI, check Mg, check iron levels Orders:   Iron, TIBC and Ferritin Panel   Magnesium 

## 2024-03-19 NOTE — Assessment & Plan Note (Signed)
 Recent stroke with speech difficulty and mild weakness. MRI confirmed. No heart abnormalities, slight reduced heart squeeze without heart failure symptoms. Discussed stroke prevention through medication and lifestyle changes. Discussed blood thinners, smoking cessation, and cholesterol management. - ContinueDAPT 3 weeks, then switch to baby aspirin . - Continue atorvastatin  for cholesterol management. - Monitor blood pressure closely. - Encourage smoking cessation.

## 2024-03-19 NOTE — Progress Notes (Signed)
 Subjective:  HPI: Chief Complaint  Patient presents with   HFU    Hx Stroke. Legs throb at night. No energy.    Discussed the use of AI scribe software for clinical note transcription with the patient, who gave verbal consent to proceed.  History of Present Illness Laurie Contreras is a 68 year old female with a history of stroke who presents for follow-up after a recent stroke.  Approximately two weeks ago, she experienced a stroke while at work. Initially, she thought she was having a migraine but then felt significant pressure in her head, which was unusual for her. She attempted to call out to a coworker but was unable to speak and subsequently collapsed, ending up on her right side. She was aware of her surroundings but could not communicate verbally. In the hospital, an MRI confirmed the presence of a stroke. She was noted to have speech difficulties and some weakness.  She was started on DAPT for three weeks, followed by aspirin  alone. She continues to take atorvastatin  for cholesterol management. She has been using nicotine  patches and medication to help with smoking cessation and has not smoked in a few weeks.  She reports no shortness of breath, choking sensation, or significant swelling in her legs, although her legs and hands swell with excessive salt intake. Her sleep has been disrupted by leg aches, which sometimes improve with stretching.  She has been experiencing increased agitation and low energy levels, which she attributes to boredom from not working. 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 Her daughter has applied for short-term disability on her behalf. She has moved in with her daughter, who monitors her activities closely due to concerns about forgetfulness and safety.  She describes herself as a physical person who is easily bored and has been unable to participate in activities like singing in the  choir, which she enjoys.   Please see Assessment and Plan below for the status of her chronic medical problems.  Objective:  Physical Exam: Vitals:   03/16/24 0905  BP: 132/66  Pulse: 74  Temp: 98.1 F (36.7 C)  TempSrc: Oral  SpO2: 96%  Weight: 171 lb 3.2 oz (77.7 kg)  Height: 5' 7 (1.702 m)   Body mass index is 26.81 kg/m. Physical Exam Vitals and nursing note reviewed.  Constitutional:      Appearance: Normal appearance.  Cardiovascular:     Rate and Rhythm: Normal rate and regular rhythm.  Pulmonary:     Effort: Pulmonary effort is normal.  Neurological:     General: No focal deficit present.     Mental Status: She is alert.  Psychiatric:        Mood and Affect: Mood normal.     Results for orders placed or performed in visit on 03/16/24  Iron, TIBC and Ferritin Panel  Result Value Ref Range   Total Iron Binding Capacity 266 250 - 450 ug/dL   UIBC 818 881 - 630 ug/dL   Iron 85 27 - 860 ug/dL   Iron Saturation 32 15 - 55 %   Ferritin 394 (H) 15 - 150 ng/mL  TSH  Result Value Ref Range   TSH 2.060 0.450 - 4.500 uIU/mL  Vitamin B12  Result Value Ref Range   Vitamin B-12 640 232 - 1,245 pg/mL  Magnesium   Result Value Ref Range   Magnesium  1.8 1.6 - 2.3 mg/dL  The ASCVD Risk score (Arnett DK, et al., 2019) failed to calculate for the following reasons:   Risk score cannot be calculated because patient has a medical history suggesting prior/existing ASCVD  Assessment & Plan:  See Encounters Tab for problem based charting. Assessment and Plan  Assessment & Plan Primary hypertension Blood pressure well-controlled. Hypertension is a stroke risk factor. Emphasized maintaining controlled blood pressure to prevent future strokes. - Continue current antihypertensive regimen.    Gastroesophageal reflux disease, unspecified whether esophagitis present On long term PPI, check Mg, check iron levels Orders:   Iron, TIBC and Ferritin Panel    Magnesium   History of stroke Recent stroke with speech difficulty and mild weakness. MRI confirmed. No heart abnormalities, slight reduced heart squeeze without heart failure symptoms. Discussed stroke prevention through medication and lifestyle changes. Discussed blood thinners, smoking cessation, and cholesterol management. - ContinueDAPT 3 weeks, then switch to baby aspirin . - Continue atorvastatin  for cholesterol management. - Monitor blood pressure closely. - Encourage smoking cessation.    Forgetfulness Occasional forgetfulness, will check TSH and B12 Orders:   TSH   Vitamin B12  Restless leg syndrome Iron panel Orders:   Iron, TIBC and Ferritin Panel  Screening mammogram for breast cancer  Orders:   MM 3D SCREENING MAMMOGRAM BILATERAL BREAST; Future  Osteoporosis screening  Orders:   DG Bone Density; Future  Screening for lung cancer Discussed Lung CA screening Orders:   CT CHEST LUNG CA SCREEN LOW DOSE W/O CM; Future  Encounter for immunization  Orders:   Flu vaccine HIGH DOSE PF(Fluzone Trivalent)  Need for Streptococcus pneumoniae vaccination  Orders:   Pneumococcal conjugate vaccine 20-valent (Prevnar 20)    Medications Ordered No orders of the defined types were placed in this encounter.  Other Orders Orders Placed This Encounter  Procedures   MM 3D SCREENING MAMMOGRAM BILATERAL BREAST    Standing Status:   Future    Expiration Date:   05/16/2025    Reason for Exam (SYMPTOM  OR DIAGNOSIS REQUIRED):   breast cancer screening    Preferred imaging location?:   [facility]   DG Bone Density    Standing Status:   Future    Expiration Date:   03/16/2025    Reason for Exam (SYMPTOM  OR DIAGNOSIS REQUIRED):   osteoporosis screening    Preferred imaging location?:   [facility]   CT CHEST LUNG CA SCREEN LOW DOSE W/O CM    Standing Status:   Future    Expiration Date:   03/16/2025    Preferred Imaging Location?:   [facility]    Flu vaccine HIGH DOSE PF(Fluzone Trivalent)   Pneumococcal conjugate vaccine 20-valent (Prevnar 20)   Iron, TIBC and Ferritin Panel   TSH   Vitamin B12   Magnesium    Follow Up: Return in about 2 months (around 05/16/2024).

## 2024-03-19 NOTE — Assessment & Plan Note (Signed)
 Blood pressure well-controlled. Hypertension is a stroke risk factor. Emphasized maintaining controlled blood pressure to prevent future strokes. - Continue current antihypertensive regimen.

## 2024-03-30 ENCOUNTER — Telehealth: Payer: Self-pay | Admitting: Internal Medicine

## 2024-03-30 NOTE — Telephone Encounter (Signed)
 Message has been forwarded to patient's PCP and Hme, who handles paperwork to follow up on the patient's request.   Copied from CRM [id number]. Topic: General - Other >> Mar 27, 2024 12:10 PM Cherylann RAMAN wrote: Reason for CRM: Patient is requesting a callback regarding her disability paperwork. Patient can be reached at [phone].

## 2024-04-03 NOTE — Telephone Encounter (Signed)
 Unable to reach pt to let her know that we have not received her disability paperwork. I left detailed message to call the clinic back.

## 2024-04-03 NOTE — Telephone Encounter (Signed)
 Called patient back, but  no answer.  Left detailed voice mail message to give our office a call back, with instructions that we would be here until 12 pm.   Copied from CRM #[id number]. Topic: General - Other >> Mar 27, 2024 12:10 PM Cherylann RAMAN wrote: Reason for CRM: Patient is requesting a callback regarding her disability paperwork. Patient can be reached at [phone]. >> Apr 01, 2024  4:58 PM Mercer PEDLAR wrote: Patient is waiting on payment for short term disability from [employer] [facility] [employer] [facility] and they are waiting on information from PCP but patient is unsure what information they need exactly. Patient stated that [employer] has reached out to office on 03/26/24. Patient would like a callback for status update.

## 2024-04-03 NOTE — Telephone Encounter (Signed)
 Reviewed and cleared inbox, I dont think I saw any disability paperwork

## 2024-04-27 ENCOUNTER — Ambulatory Visit: Payer: Self-pay

## 2024-04-27 NOTE — Telephone Encounter (Signed)
 FYI Only or Action Required?: FYI only for provider: appointment scheduled on 12/1.  Patient was last seen in primary care on 03/16/2024 by Laurie Dayton BROCKS, DO.  Called Nurse Triage reporting Leg Swelling.  Symptoms began ongoing problem.  Symptoms are: gradually worsening.  Triage Disposition: See PCP Within 2 Weeks  Patient/caregiver understands and will follow disposition?: Yes     Copied from CRM [id number]. Topic: Clinical - Red Word Triage >> Apr 27, 2024  9:28 AM Laurie Contreras wrote: Red Word that prompted transfer to Nurse Triage: Patient is wanting to make an appt to see Dr. Rosan. States her left knee is flaring up. She's been out of work since she had a stroke and she's also concerned because she's been doing a lot of sleeping. She states her knee swelling has gone down. She also wants to speak with him about possible physical therapy & going back to work. Patient also mentioned that she's been having some headaches that just doesn't feel normal to her. States it feels like there is pressure in her head.     Reason for Disposition  Knee swelling is a chronic symptom (recurrent or ongoing AND present > 4 weeks)  Answer Assessment - Initial Assessment Questions 1. LOCATION: Where is the swelling located?  (e.g., left, right, both knees)     Left knee 2. ONSET: When did the swelling start? Does it come and go, or is it there all the time?     Ongoing problem 3. SWELLING: How bad is the swelling? Or, How large is it? (e.g., mild, moderate, severe; size of localized swelling)      Mild  4. PAIN: Is there any pain? If Yes, ask: How bad is it? (Scale 0-10; or none, mild, moderate, severe)     Mild intermittent pain  5. SETTING: Has there been any recent work, exercise or other activity that involved that part of the body?      no 7. ASSOCIATED SYMPTOMS: Is there any pain or redness?     No 8. OTHER SYMPTOMS: Do you have any other symptoms? (e.g., calf  pain, chest pain, difficulty breathing, fever)     Intermittent fatigue and headaches  Protocols used: Knee Swelling-A-AH

## 2024-04-27 NOTE — Telephone Encounter (Signed)
 Pt changed the appt; she will be coming tomorrow 11/25 with Dr Norrine @ 0915 AM.

## 2024-04-27 NOTE — Telephone Encounter (Addendum)
 Called pt who stated her left leg is swelling and having intermittent h/a's since the stroke. I asked if she had checked her BP ; she stated it's good;she  made an appt for 12/1;  I asked her if she wanted to be seen sooner;we have appts tomorrow. Stated she has to talk to her daughter and callback.

## 2024-04-27 NOTE — Telephone Encounter (Unsigned)
 Copied from CRM #[id number]. Topic: Clinical - Red Word Triage >> Apr 27, 2024  9:28 AM Laurie Contreras wrote: Red Word that prompted transfer to Nurse Triage: Patient is wanting to make an appt to see Dr. Rosan. States her left knee is flaring up. She's been out of work since she had a stroke and she's also concerned because she's been doing a lot of sleeping. She states her knee swelling has gone down. She also wants to speak with him about possible physical therapy & going back to work. Patient also mentioned that she's been having some headaches that just doesn't feel normal to her. States it feels like there is pressure in her head. >> Apr 27, 2024 10:06 AM Miquel SAILOR wrote: PT returning [facility] call on earlier app after talking to Daughter. Called and transferred to office

## 2024-04-28 ENCOUNTER — Ambulatory Visit: Payer: Self-pay | Admitting: Student

## 2024-04-28 ENCOUNTER — Encounter: Payer: Self-pay | Admitting: Student

## 2024-04-28 ENCOUNTER — Other Ambulatory Visit: Payer: Self-pay

## 2024-04-28 VITALS — BP 136/63 | HR 74 | Temp 98.1°F | Ht 67.0 in | Wt 175.4 lb

## 2024-04-28 DIAGNOSIS — I10 Essential (primary) hypertension: Secondary | ICD-10-CM

## 2024-04-28 DIAGNOSIS — M1712 Unilateral primary osteoarthritis, left knee: Secondary | ICD-10-CM

## 2024-04-28 MED ORDER — ACETAMINOPHEN 500 MG PO TABS: 1000.0000 mg | ORAL_TABLET | Freq: Four times a day (QID) | ORAL | Status: AC | PRN

## 2024-04-28 NOTE — Patient Instructions (Signed)
 VISIT SUMMARY: Today, we discussed your knee pain, blood pressure management, and recent stroke. We reviewed your current medications and made some adjustments to better manage your symptoms and overall health.  YOUR PLAN: UNILATERAL PRIMARY OSTEOARTHRITIS, LEFT KNEE: You have chronic osteoarthritis in your left knee, which has recently flared up, causing pain and swelling. -Be cautious with ibuprofen  due to risks associated with your stroke history and potential kidney strain. -Take Tylenol  up to 1000 mg four times daily for pain management. -Continue using Voltaren  gel daily. -Apply ice to your knee when resting to reduce swelling. -You are referred to physical therapy for knee exercises. -Consider knee replacement surgery if symptoms persist. -Follow the home exercise instructions provided.  ESSENTIAL HYPERTENSION: Your blood pressure is generally well-controlled with occasional elevations. -Continue your current blood pressure medications (amlodipine  and benazepril ). -Monitor your blood pressure at home every other day or every few days. -Bring your blood pressure machine to your next appointment.  HISTORY OF CEREBROVASCULAR ACCIDENT (STROKE) WITHOUT RESIDUAL DEFICITS: You had a stroke in September but have no lasting neurological issues. -Avoid regular use of ibuprofen  due to the increased risk of another stroke. -Use Tylenol  for pain management instead.  TOBACCO USE, CURRENT NON-USER: You have successfully quit smoking, which is beneficial for your cardiovascular health. -Continue to abstain from smoking.  Remember to bring all of the medications that you take (including over the counter medications and supplements) with you to every clinic visit.  This after visit summary is an important review of tests, referrals, and medication changes that were discussed during your visit. If you have questions or concerns, call [phone]. Outside of clinic business hours, call the main  hospital at [phone] and ask the operator for the on-call internal medicine resident.   Ozell Kung MD 04/28/2024, 9:56 AM

## 2024-04-28 NOTE — Assessment & Plan Note (Addendum)
 Chronic with ongoing exacerbation of ~3 weeks duration. Decrease NSAID use in favor of Tylenol . Continue topical NSAIDs. Too soon for another corticosteroid injection, she got ~1 month of relief from her last one. Requesting PT referral. Referral for knee arthroplasty is next step. Orders:   Ambulatory referral to Physical Therapy   acetaminophen  (TYLENOL ) 500 MG tablet; Take 2 tablets (1,000 mg total) by mouth every 6 (six) hours as needed for mild pain (pain score 1-3).

## 2024-04-28 NOTE — Assessment & Plan Note (Addendum)
 BP Readings from Last 3 Encounters:  04/28/24 136/63  03/16/24 132/66  02/27/24 128/71   Chronic, stable. Concordant measurements at home. Given stroke history a lower BP goal would be better. Today it's confounded by recent NSAID use. Continue home BP monitoring every few days, bring log and BP cuff in to next visit. Continue amlodipine -benazepril  10-20 mg daily for now.

## 2024-04-28 NOTE — Progress Notes (Unsigned)
 Patient name: Laurie Contreras Date of birth: 12-Feb-1956 Date of visit: 04/28/24  Subjective:  Reason for visit: Acute Visit (PER E2C2/( LEFT )   leg and knee reporting Leg Swelling./ /Symptoms began ongoing problem./ /Symptoms are: gradually worsening./ /)  Discussed the use of AI scribe software for clinical note transcription with the patient, who gave verbal consent to proceed.  History of Present Illness   Laurie Contreras is a 68 year old female with arthritis who presents with knee pain. She is accompanied by her daughter, who is her primary caregiver.  Knee pain and swelling - Knee pain present for 2-3 weeks - Pain characterized as throbbing, especially at the posterior aspect of the knee - Pain worsens with rainy weather - Associated swelling without redness or heat - Received cortisone injection 2 months ago - Uses ibuprofen  and Voltaren  gel for pain management - Increased use of pain medication since September  Functional status and fatigue - Decreased activity level since stroke in September - Increased napping and decreased energy  Cerebrovascular event - Stroke occurred in September - No residual neurological deficits  Hypertension - Managed with Lotrel - Home blood pressure readings average 130/80 mmHg, occasionally reaching the 140s - Takes aspirin  daily  Substance use - Quit smoking; currently chews gum - Minimal alcohol consumption: two small shots in the past month and a half         Current Outpatient Medications  Medication Instructions   acetaminophen  (TYLENOL ) 1,000 mg, Oral, Every 6 hours PRN   amLODipine -benazepril  (LOTREL) 10-20 MG capsule 1 capsule, Oral, Daily   aspirin  EC 81 mg, Oral, Daily, Swallow whole.   atorvastatin  (LIPITOR) 40 mg, Oral, Daily   Multiple Vitamin (MULTIVITAMIN WITH MINERALS) TABS tablet 1 tablet, Daily   nicotine  (NICODERM CQ  - DOSED IN MG/24 HOURS) 14 mg, Transdermal, Daily   omeprazole  (PRILOSEC OTC) 20 mg, Daily  PRN    Objective: Today's Vitals   04/28/24 0913 04/28/24 0916  BP: (!) 146/67 136/63  Pulse: 75 74  Temp: 98.1 F (36.7 C)   TempSrc: Oral   SpO2: 94%   Weight: 175 lb 6.4 oz (79.6 kg)   Height: 5' 7 (1.702 m)   PainSc: 6    Body mass index is 27.47 kg/m.   Physical Exam Constitutional:      Appearance: Normal appearance.  Cardiovascular:     Rate and Rhythm: Normal rate and regular rhythm.     Pulses: Normal pulses.  Pulmonary:     Effort: Pulmonary effort is normal. No respiratory distress.  Musculoskeletal:     Comments: Left knee without swelling, warmth, redness or effusion. No joint line tenderness. Normal ROM. Antalgic gait, favoring left leg.  Skin:    General: Skin is warm and dry.  Neurological:     Mental Status: She is alert.     Cranial Nerves: No facial asymmetry.  Psychiatric:        Mood and Affect: Affect normal.        Speech: Speech normal.        Behavior: Behavior normal.     Assessment & Plan Primary osteoarthritis of left knee Chronic with ongoing exacerbation of ~3 weeks duration. Decrease NSAID use in favor of Tylenol . Continue topical NSAIDs. Too soon for another corticosteroid injection, she got ~1 month of relief from her last one. Requesting PT referral. Referral for knee arthroplasty is next step. Orders:   Ambulatory referral to Physical Therapy   acetaminophen  (TYLENOL ) 500 MG tablet;  Take 2 tablets (1,000 mg total) by mouth every 6 (six) hours as needed for mild pain (pain score 1-3).   Primary hypertension BP Readings from Last 3 Encounters:  04/28/24 136/63  03/16/24 132/66  02/27/24 128/71   Chronic, stable. Concordant measurements at home. Given stroke history a lower BP goal would be better. Today it's confounded by recent NSAID use. Continue home BP monitoring every few days, bring log and BP cuff in to next visit. Continue amlodipine -benazepril  10-20 mg daily for now.       Return in about 3 months (around  07/29/2024) for hypertension, knee osteoarthritis.  Ozell Kung MD 04/28/2024, 10:03 AM

## 2024-04-28 NOTE — Progress Notes (Signed)
 Internal Medicine Clinic Attending  Case discussed with the resident at the time of the visit.  We reviewed the resident's history and exam and pertinent patient test results.  I agree with the assessment, diagnosis, and plan of care documented in the resident's note.

## 2024-05-04 ENCOUNTER — Ambulatory Visit: Payer: Self-pay | Admitting: Student

## 2024-05-25 ENCOUNTER — Encounter: Payer: Self-pay | Admitting: *Deleted

## 2024-05-25 ENCOUNTER — Telehealth: Payer: Self-pay

## 2024-05-25 ENCOUNTER — Telehealth: Payer: Self-pay | Admitting: *Deleted

## 2024-05-25 NOTE — Telephone Encounter (Signed)
 Appt schedule with Dr Nguyen tomorrow 05/26/24.

## 2024-05-25 NOTE — Telephone Encounter (Signed)
 I contacted pt to let her know that I received her short term disability, but no answer. Per Dr. Rosan, dont see any other information about her being out of work.  She may need a visit to discuss this paperwork. I left detailed message to call the clinic back.

## 2024-05-25 NOTE — Telephone Encounter (Signed)
 Pt's daughter agreed with scheduling an appt - call transferred to front office.

## 2024-05-25 NOTE — Telephone Encounter (Signed)
 PT info per referral sent to pt via My Chart. Pt can call to schedule an appt: [facility] at [facility] Physical therapist in [place], [place]    Address: [address] [place], [place], [place] [address]   Phone: [phone]

## 2024-05-25 NOTE — Telephone Encounter (Signed)
 Yes I think an appointment would be best to try to get to the bottom of all of this

## 2024-05-25 NOTE — Telephone Encounter (Signed)
 I saw the form in my box but I really am not sure what is going on or who initally wrote for her to be out of work.  It sounds like she will need a visit to get all of this sorted out, may need to be with one of the residents given time constraints.

## 2024-05-25 NOTE — Telephone Encounter (Signed)
 Copied from CRM #[id number]. Topic: General - Other >> May 25, 2024 12:22 PM Chiquita SQUIBB wrote: Reason for CRM:  -Patients daughter is calling in asking if the office has received the extension for disability form from [facility] on 12/10.  - She is also asking if theres any way to have her mothers memory tested as the patient has been having trouble since her stroke and gets frustrated easily when her mind isn't there and if theres any resources they can use to have her mom checked and also explain to her mom the memory loss.  - Patients daughter would also like to get the physical therapy scheduled but has not had anyone reach to them for scheduling.   Patients daughter is Jocquita [phone]

## 2024-05-25 NOTE — Telephone Encounter (Signed)
 Pt's daughter stated she re-faxed the disability form today herself. She stated she's concerned about he mother's cognitive status since the stroke. Stated pt's long term memory is good but not her short-term. She wants to know if pt needs to schedule an appt in order to get a referral ie neuro rehab.; I will ask Dr Rosan.

## 2024-05-26 ENCOUNTER — Ambulatory Visit: Payer: Self-pay

## 2024-05-26 VITALS — BP 136/75 | HR 76 | Temp 98.0°F | Ht 67.0 in | Wt 177.4 lb

## 2024-05-26 DIAGNOSIS — R4701 Aphasia: Secondary | ICD-10-CM

## 2024-05-26 DIAGNOSIS — R531 Weakness: Secondary | ICD-10-CM | POA: Diagnosis not present

## 2024-05-26 DIAGNOSIS — Z8673 Personal history of transient ischemic attack (TIA), and cerebral infarction without residual deficits: Secondary | ICD-10-CM

## 2024-05-26 NOTE — Progress Notes (Unsigned)
 "  Patient name: Laurie Contreras Date of birth: 11-Dec-1955 Date of visit: 05/31/2024  Type of visit: Acute Office Visit   Subjective   Chief concern:  Chief Complaint  Patient presents with   Follow up     Pt is here to discuss about short term disability form.    Nose Bleed     Laurie Contreras is a 68 y.o. female with a history of CVA (02/26/2024), HTN, HLD, GERD, who presents to [facility] clinic to discuss short term disability paperwork renewal.  She is accompanied by her daughter to the visit today.  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  At that time, she had presented to the [facility] as a code stroke ED and was admitted for stroke workup.  Since then, she has not been back to work as she is having occasional memory difficulties, occasional aphasia and decreased strength with certain activities.  Of note, she did experience aphasia during her hospitalization after the stroke, though strength was relatively normal at that time.  She was referred to physical therapy in the outpatient setting though had not attended this yet.  Her daughter reports that her daily tasks are more difficult to complete, and she has concern regarding her cognition.  ROS: Denies headaches, dizziness, fever, chills, runny nose, sore throat, vision changes, hearing changes, chest pain, shortness of breath, difficulty breathing, nausea, vomiting, abdominal pain. Denies increased urinary frequency, pain with urination, constipation or diarrhea. No recent falls.   Patient Active Problem List   Diagnosis Date Noted   History of stroke 02/26/2024   Elevated serum creatinine 02/26/2024   Hypokalemia 02/26/2024   Abnormal mammogram of right breast 06/14/2022   Estrogen deficiency 06/14/2022   Acne 06/14/2022   Insomnia 08/08/2020   Gastroesophageal reflux disease    Atypical chest pain 07/28/2020   OA (osteoarthritis) of knee 07/27/2020    Healthcare maintenance 10/07/2019   Bereavement due to life event 09/05/2018   Right shoulder pain 05/20/2018   History of colon polyps 04/09/2017   Left breast mass 03/13/2017   Low back pain 02/22/2017   Hyperlipidemia 02/22/2017   Aortic atherosclerosis 02/21/2017   Unsteady gait    Lower extremity weakness 08/24/2014   Tremor 08/24/2014   Hypertension 08/24/2014     Past Surgical History:  Procedure Laterality Date   TONSILLECTOMY       Current Outpatient Medications  Medication Instructions   acetaminophen  (TYLENOL ) 1,000 mg, Oral, Every 6 hours PRN   amLODipine -benazepril  (LOTREL) 10-20 MG capsule 1 capsule, Oral, Daily   aspirin  EC 81 mg, Oral, Daily, Swallow whole.   atorvastatin  (LIPITOR) 40 mg, Oral, Daily   Multiple Vitamin (MULTIVITAMIN WITH MINERALS) TABS tablet 1 tablet, Daily   nicotine  (NICODERM CQ  - DOSED IN MG/24 HOURS) 14 mg, Transdermal, Daily   omeprazole  (PRILOSEC OTC) 20 mg, Daily PRN    Social History[1]    Objective  Today's Vitals   05/26/24 0831  BP: 136/75  Pulse: 76  Temp: 98 F (36.7 C)  TempSrc: Oral  SpO2: 97%  Weight: 177 lb 6.4 oz (80.5 kg)  Height: 5' 7 (1.702 m)  Body mass index is 27.78 kg/m.   Physical Exam:   Constitutional: well-appearing female sitting in exam chair, in no acute distress.  HEENT: normocephalic atraumatic, mucous membranes moist Eyes: conjunctiva non-erythematous Cardiovascular: regular rate and rhythm Pulmonary/Chest: normal work of breathing on room air, lungs clear  to auscultation bilaterally Abdominal: soft, non-tender, non-distended MSK: normal bulk and tone. Neurological: alert & oriented x 3 Skin: warm and dry Psych: mood calm, behavior normal, thought content normal, judgement normal      The ASCVD Risk score (Arnett DK, et al., 2019) failed to calculate for the following reasons:   Risk score cannot be calculated because patient has a medical history suggesting prior/existing ASCVD   *  - Cholesterol units were assumed      Assessment & Plan  Problem List Items Addressed This Visit     History of stroke - Primary   Patient sustained acute ischemic stroke on 02/26/2024 which was confirmed on MRI and initially presented with aphasia and mild weakness.  She has been undergoing lifestyle changes and stroke prevention now status post DAPT therapy, and currently on aspirin  81 mg daily and atorvastatin  40 mg daily.  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  She is requesting extension of her short-term disability today due to continued mild weakness, occasional aphasia, and some memory concerns.  Given the memory concern, I would like to refer patient to speech-language therapy for cognitive assessment and evaluation regarding the aphasia.  She has been referred to physical therapy and plans to increase her strength and stability.  Short-term disability [employer] paperwork filled out and signed, and placed in signed documents bin to be faxed.  She should return in 3 months for routine evaluation, or sooner if needed. - Continue Aspirin  81 mg daily - Continue Atorvastatin  40 mg daily - Recommend continued encouragement of smoking cessation and monitoring blood pressure closely       Relevant Orders   Ambulatory referral to Speech Therapy    Return in about 3 months (around 08/24/2024).  Patient discussed with Dr. Trudy.  Doyal Miyamoto, MD [facility]  PGY-1 05/31/2024, 5:12 PM      [1]  Social History Tobacco Use   Smoking status: Former    Current packs/day: 0.50    Average packs/day: 0.5 packs/day for 40.0 years (20.0 ttl pk-yrs)    Types: Cigarettes   Smokeless tobacco: Never   Tobacco comments:    Cutting back  Substance Use Topics   Alcohol use: Not Currently    Alcohol/week: 1.0 standard drink of alcohol    Types: 1 Cans of beer per week    Comment: Rarely   Drug use: No   "

## 2024-05-26 NOTE — Patient Instructions (Addendum)
 Thank you, Laurie Contreras for allowing us  to provide your care today. Today we discussed the following:  - PT: Please call [phone] to schedule physical therapy.  [facility] at [facility] Physical therapist in [place], [place]  [address] [place], [place], [place] [address]   Referral:  Speech Language for Cognitive Assessment     Follow up: 2-3 months for hypertension    Should you have any questions or concerns please call the Internal Medicine Clinic at [phone].     Doyal Miyamoto, MD [facility]

## 2024-05-31 NOTE — Assessment & Plan Note (Addendum)
 Patient sustained acute ischemic stroke on 02/26/2024 which was confirmed on MRI and initially presented with aphasia and mild weakness.  She has been undergoing lifestyle changes and stroke prevention now status post DAPT therapy, and currently on aspirin  81 mg daily and atorvastatin  40 mg daily.  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  She is requesting extension of her short-term disability today due to continued mild weakness, occasional aphasia, and some memory concerns.  Given the memory concern, I would like to refer patient to speech-language therapy for cognitive assessment and evaluation regarding the aphasia.  She has been referred to physical therapy and plans to increase her strength and stability.  Short-term disability [employer] paperwork filled out and signed, and placed in signed documents bin to be faxed.  She should return in 3 months for routine evaluation, or sooner if needed. - Continue Aspirin  81 mg daily - Continue Atorvastatin  40 mg daily - Recommend continued encouragement of smoking cessation and monitoring blood pressure closely

## 2024-06-05 ENCOUNTER — Telehealth: Payer: Self-pay | Admitting: *Deleted

## 2024-06-05 NOTE — Telephone Encounter (Signed)
 Copied from CRM [id number]. Topic: Clinical - Medical Advice >> Jun 05, 2024 11:04 AM Marda MATSU wrote: Reason for CRM: Please call daughter Ms Trudy regarding documentation that is needed for the [employer]. Dr. Nguyen, Diana, MD  stated she would submit it. Has she done so?   Please advise.

## 2024-06-08 NOTE — Progress Notes (Signed)
 Internal Medicine Clinic Attending  Case discussed with the resident at the time of the visit.  We reviewed the residents history and exam and pertinent patient test results.  I agree with the assessment, diagnosis, and plan of care documented in the residents note. Agree with post-stroke cognitive evaluation with SLP.

## 2024-06-09 NOTE — Telephone Encounter (Signed)
 I called and spoke with Laurie Contreras regarding short term disability form for her mother. [name] states the [employer] has already received the documents that was sent to them. Advised Laurie Contreras to call back if she further assistance.

## 2024-06-12 ENCOUNTER — Ambulatory Visit: Attending: Internal Medicine

## 2024-06-12 DIAGNOSIS — M6281 Muscle weakness (generalized): Secondary | ICD-10-CM | POA: Insufficient documentation

## 2024-06-12 DIAGNOSIS — M1712 Unilateral primary osteoarthritis, left knee: Secondary | ICD-10-CM | POA: Diagnosis not present

## 2024-06-12 DIAGNOSIS — G8929 Other chronic pain: Secondary | ICD-10-CM | POA: Insufficient documentation

## 2024-06-12 DIAGNOSIS — R2689 Other abnormalities of gait and mobility: Secondary | ICD-10-CM | POA: Insufficient documentation

## 2024-06-12 DIAGNOSIS — M25562 Pain in left knee: Secondary | ICD-10-CM | POA: Diagnosis present

## 2024-06-12 NOTE — Therapy (Signed)
 " OUTPATIENT PHYSICAL THERAPY LOWER EXTREMITY EVALUATION   Patient Name: Laurie Contreras MRN: [id number] DOB:10-Sep-1955, 69 y.o., female Today's Date: 06/12/2024  END OF SESSION:   Past Medical History:  Diagnosis Date   Hypertension    Stroke (HCC) 02/26/2024   Tobacco abuse 09/08/2014   Past Surgical History:  Procedure Laterality Date   TONSILLECTOMY     Patient Active Problem List   Diagnosis Date Noted   History of stroke 02/26/2024   Elevated serum creatinine 02/26/2024   Hypokalemia 02/26/2024   Abnormal mammogram of right breast 06/14/2022   Estrogen deficiency 06/14/2022   Acne 06/14/2022   Insomnia 08/08/2020   Gastroesophageal reflux disease    Atypical chest pain 07/28/2020   OA (osteoarthritis) of knee 07/27/2020   Healthcare maintenance 10/07/2019   Bereavement due to life event 09/05/2018   Right shoulder pain 05/20/2018   History of colon polyps 04/09/2017   Left breast mass 03/13/2017   Low back pain 02/22/2017   Hyperlipidemia 02/22/2017   Aortic atherosclerosis 02/21/2017   Unsteady gait    Lower extremity weakness 08/24/2014   Tremor 08/24/2014   Hypertension 08/24/2014    PCP: Rosan Dayton BROCKS, DO   REFERRING PROVIDER: Trudy Mliss Dragon, MD   REFERRING DIAG: [phone] (ICD-10-CM) - Primary osteoarthritis of left knee   THERAPY DIAG:  No diagnosis found.  Rationale for Evaluation and Treatment: Rehabilitation  ONSET DATE: ***  SUBJECTIVE:   SUBJECTIVE STATEMENT: ***  PERTINENT HISTORY: ***  PAIN:    Are you having pain?  Yes: NPRS scale: 0/10 Worst: 10/10 Pain location: left knee Pain description: dull ache, sore Aggravating factors: weather,  Relieving factors: ***  PRECAUTIONS: None  RED FLAGS: None   WEIGHT BEARING RESTRICTIONS: No  FALLS:  Has patient fallen in last 6 months? Yes. Number of falls - one when she had her stroke  LIVING ENVIRONMENT: Lives with: {[facility] lives with:25569::lives with their  family} Lives in: {Lives in:25570} Stairs: {opstairs:27293} Has following equipment at home: {Assistive devices:23999}  OCCUPATION: ***  PLOF: {PLOF:24004}  PATIENT GOALS: ***  NEXT MD VISIT: ***  OBJECTIVE:  Note: Objective measures were completed at Evaluation unless otherwise noted.  DIAGNOSTIC FINDINGS: ***  PATIENT SURVEYS:  {rehab surveys:24030}  COGNITION: Overall cognitive status: {cognition:24006}     SENSATION: {sensation:27233}  EDEMA:  {edema:24020}  MUSCLE LENGTH: Hamstrings: Right *** deg; Left *** deg Debby test: Right *** deg; Left *** deg  POSTURE: {posture:25561}  PALPATION: ***  LOWER EXTREMITY ROM:  {AROM/PROM:27142} ROM Right eval Left eval  Hip flexion    Hip extension    Hip abduction    Hip adduction    Hip internal rotation    Hip external rotation    Knee flexion    Knee extension    Ankle dorsiflexion    Ankle plantarflexion    Ankle inversion    Ankle eversion     (Blank rows = not tested)  LOWER EXTREMITY MMT:  MMT Right eval Left eval  Hip flexion    Hip extension    Hip abduction    Hip adduction    Hip internal rotation    Hip external rotation    Knee flexion    Knee extension    Ankle dorsiflexion    Ankle plantarflexion    Ankle inversion    Ankle eversion     (Blank rows = not tested)  LOWER EXTREMITY SPECIAL TESTS:  {LEspecialtests:26242}  FUNCTIONAL TESTS:  {Functional tests:24029}  GAIT: Distance walked: ***  Assistive device utilized: {Assistive devices:23999} Level of assistance: {Levels of assistance:24026} Comments: ***   TREATMENT: [facility] Adult PT Treatment:                                                DATE: *** Therapeutic Exercise: *** Manual Therapy: *** Neuromuscular re-ed: *** Therapeutic Activity: *** Modalities: *** Self Care: ***  PATIENT EDUCATION:  Education details: *** Person educated: {Person educated:25204} Education method: {Education  Method:25205} Education comprehension: {Education Comprehension:25206}  HOME EXERCISE PROGRAM: Access Code: [email or web address] URL: [email or web address] Date: 06/12/2024 Prepared by: Alm Kingdom  Exercises - Supine Quadricep Sets  - 1-2 x daily - 7 x weekly - 2 sets - 10 reps - 5 sec hold - Active Straight Leg Raise with Quad Set  - 1-2 x daily - 7 x weekly - 2-3 sets - 10 reps - Hooklying Clamshell with Resistance  - 1-2 x daily - 7 x weekly - 3 sets - 15 reps - blue  hold - Seated Long Arc Quad  - 1-2 x daily - 7 x weekly - 2-3 sets - 10 reps - 5 sec hold  ASSESSMENT:  CLINICAL IMPRESSION: Patient is a *** y.o. *** who was seen today for physical therapy evaluation and treatment for ***.   OBJECTIVE IMPAIRMENTS: {opptimpairments:25111}.   ACTIVITY LIMITATIONS: {activitylimitations:27494}  PARTICIPATION LIMITATIONS: {participationrestrictions:25113}  PERSONAL FACTORS: {Personal factors:25162} are also affecting patient's functional outcome.   REHAB POTENTIAL: {rehabpotential:25112}  CLINICAL DECISION MAKING: {clinical decision making:25114}  EVALUATION COMPLEXITY: {Evaluation complexity:25115}   GOALS: Goals reviewed with patient? No  SHORT TERM GOALS: Target date: 07/03/2024   Pt will be compliant and knowledgeable with initial HEP for improved comfort and carryover Baseline: initial HEP given  Goal status: INITIAL  2.  Pt will self report *** pain no greater than ***/10 for improved comfort and functional ability Baseline: ***/10 at worst Goal status: {GOALSTATUS:25110}   LONG TERM GOALS: Target date: ***  *** Baseline:  Goal status: INITIAL  2.  *** Baseline:  Goal status: INITIAL  3.  *** Baseline:  Goal status: INITIAL  4.  *** Baseline:  Goal status: INITIAL  5.  *** Baseline:  Goal status: INITIAL  6.  *** Baseline:  Goal status: INITIAL   PLAN:  PT FREQUENCY: {rehab frequency:25116}  PT DURATION: {rehab  duration:25117}  PLANNED INTERVENTIONS: {rehab planned interventions:25118::97110-Therapeutic exercises,97530- Therapeutic [phone]- Neuromuscular re-education,97535- Self Rjmz,02859- Manual therapy,Patient/Family education}  PLAN FOR NEXT SESSION: PIERRETTE Alm JAYSON Kingdom, PT 06/12/2024, 7:57 AM  "

## 2024-06-17 ENCOUNTER — Ambulatory Visit

## 2024-06-17 DIAGNOSIS — M6281 Muscle weakness (generalized): Secondary | ICD-10-CM

## 2024-06-17 DIAGNOSIS — G8929 Other chronic pain: Secondary | ICD-10-CM

## 2024-06-17 DIAGNOSIS — M25562 Pain in left knee: Secondary | ICD-10-CM | POA: Diagnosis not present

## 2024-06-17 DIAGNOSIS — R2689 Other abnormalities of gait and mobility: Secondary | ICD-10-CM

## 2024-06-17 NOTE — Therapy (Signed)
 " OUTPATIENT PHYSICAL THERAPY TREATMENT   Patient Name: Laurie Contreras MRN: [id number] DOB:1956-03-28, 69 y.o., female Today's Date: 06/17/2024  END OF SESSION:  PT End of Session - 06/17/24 0923     Visit Number 2    Number of Visits 17    Date for Recertification  08/09/24    Authorization Type BC[employer]    PT Start Time 0930    Activity Tolerance Patient tolerated treatment well    Behavior During Therapy [facility] for tasks assessed/performed           Past Medical History:  Diagnosis Date   Hypertension    Stroke (HCC) 02/26/2024   Tobacco abuse 09/08/2014   Past Surgical History:  Procedure Laterality Date   TONSILLECTOMY     Patient Active Problem List   Diagnosis Date Noted   History of stroke 02/26/2024   Elevated serum creatinine 02/26/2024   Hypokalemia 02/26/2024   Abnormal mammogram of right breast 06/14/2022   Estrogen deficiency 06/14/2022   Acne 06/14/2022   Insomnia 08/08/2020   Gastroesophageal reflux disease    Atypical chest pain 07/28/2020   OA (osteoarthritis) of knee 07/27/2020   Healthcare maintenance 10/07/2019   Bereavement due to life event 09/05/2018   Right shoulder pain 05/20/2018   History of colon polyps 04/09/2017   Left breast mass 03/13/2017   Low back pain 02/22/2017   Hyperlipidemia 02/22/2017   Aortic atherosclerosis 02/21/2017   Unsteady gait    Lower extremity weakness 08/24/2014   Tremor 08/24/2014   Hypertension 08/24/2014    PCP: Rosan Dayton BROCKS, DO   REFERRING PROVIDER: Trudy Mliss Dragon, MD   REFERRING DIAG: [phone] (ICD-10-CM) - Primary osteoarthritis of left knee   THERAPY DIAG:  Chronic pain of left knee  Muscle weakness (generalized)  Other abnormalities of gait and mobility  Rationale for Evaluation and Treatment: Rehabilitation  ONSET DATE: Chronic  SUBJECTIVE:   SUBJECTIVE STATEMENT: Pt presents to PT with no current knee pain. Has been compliant with HEP.   EVAL: Pt presents to PT with  daughter, she reports chronic history of L knee pain made worse with cold and rainy weather. She had a CVA in September of 2025 with no residual deficits thankfully. There has been no trauma or MOI to her L knee, but she has some occasional buckling and when it is painful she does not really leave the house. Pain is mainly directed at medial knee.  PERTINENT HISTORY: HTN, CVA  PAIN:    Are you having pain?  Yes: NPRS scale: 0/10 Worst: 10/10 Pain location: left knee Pain description: dull ache, sore Aggravating factors: weather, cold Relieving factors: rest  PRECAUTIONS: None  RED FLAGS: None   WEIGHT BEARING RESTRICTIONS: No  FALLS:  Has patient fallen in last 6 months? Yes. Number of falls - one when she had her stroke  LIVING ENVIRONMENT: Lives with: lives with their family Lives in: House/apartment  OCCUPATION: [employer]  PLOF: Independent  PATIENT GOALS: decrease knee pain, exercise more   OBJECTIVE:  Note: Objective measures were completed at Evaluation unless otherwise noted.  DIAGNOSTIC FINDINGS: N/A  PATIENT SURVEYS:  LEFS  Extreme difficulty/unable (0), Quite a bit of difficulty (1), Moderate difficulty (2), Little difficulty (3), No difficulty (4) Survey date:  06/12/24  Any of your usual work, housework or school activities 4  2. Usual hobbies, recreational or sporting activities 4  3. Getting into/out of the bath 4  4. Walking between rooms 4  5. Putting on  socks/shoes 4  6. Squatting  4  7. Lifting an object, like a bag of groceries from the floor 4  8. Performing light activities around your home 4  9. Performing heavy activities around your home 0  10. Getting into/out of a car 4  11. Walking 2 blocks 0  12. Walking 1 mile 0  13. Going up/down 10 stairs (1 flight) 4  14. Standing for 1 hour 3  15.  sitting for 1 hour 4  16. Running on even ground 0  17. Running on uneven ground 0  18. Making sharp turns while running fast 0  19. Hopping  0   20. Rolling over in bed 4  Score total:  47/80     COGNITION: Overall cognitive status: Within functional limits for tasks assessed     SENSATION: WFL  EDEMA:  DNT  POSTURE: rounded shoulders, forward head, and L genu valgus  PALPATION: TTP to L medial knee joint line, distal L quad  LOWER EXTREMITY ROM:  Active ROM Right eval Left eval  Hip flexion    Hip extension    Hip abduction    Hip adduction    Hip internal rotation    Hip external rotation    Knee flexion WNL WNL  Knee extension WNL WNL  Ankle dorsiflexion    Ankle plantarflexion    Ankle inversion    Ankle eversion     (Blank rows = not tested)  LOWER EXTREMITY MMT:  MMT Right eval Left eval  Hip flexion 4 3  Hip extension    Hip abduction 3+ 3+  Hip adduction    Hip internal rotation    Hip external rotation    Knee flexion 5 4  Knee extension 5 4  Ankle dorsiflexion    Ankle plantarflexion    Ankle inversion    Ankle eversion     (Blank rows = not tested)  LOWER EXTREMITY SPECIAL TESTS:  Knee special tests: Anterior drawer test: negative, Posterior drawer test: negative, and Lachman Test: negative  FUNCTIONAL TESTS:  30 Second Sit to Stand: 12 reps  GAIT: Distance walked: 41ft Assistive device utilized: None Level of assistance: Complete Independence Comments: decreased gait speed   TREATMENT: [facility] Adult PT Treatment:                                                DATE: 06/17/24 NuStep lvl 5 UE/LE x 5 min for functional activity  LAQ 2x10 3# Supine SLR 2x10 ea FM squat 2x8 B UE Standing hip 2x10 YTB TKE with ball 2x10 - 5 hold L Supine QS x 5 - 5 hold SLR x 5 - L LE Seated clamshell x 15 blue band LAQ x 5 L  [facility] Adult PT Treatment:                                                DATE: 06/12/24 Therapeutic Exercise: Supine QS x 5 - 5 hold SLR x 5 - L LE Seated clamshell x 15 blue band LAQ x 5 L  PATIENT EDUCATION:  Education details: eval findings, LEFS, HEP,  POC Person educated: Patient Education method: Explanation, Demonstration, and Handouts Education comprehension: verbalized understanding and returned demonstration  HOME EXERCISE PROGRAM: Access Code: [email or web address] URL: [email or web address] Date: 06/12/2024 Prepared by: Alm Kingdom  Exercises - Supine Quadricep Sets  - 1-2 x daily - 7 x weekly - 2 sets - 10 reps - 5 sec hold - Active Straight Leg Raise with Quad Set  - 1-2 x daily - 7 x weekly - 2-3 sets - 10 reps - Hooklying Clamshell with Resistance  - 1-2 x daily - 7 x weekly - 3 sets - 15 reps - blue  hold - Seated Long Arc Quad  - 1-2 x daily - 7 x weekly - 2-3 sets - 10 reps - 5 sec hold  ASSESSMENT:  CLINICAL IMPRESSION: Pt was able to complete all prescribed exercises with no adverse effect. Today we focused on quad and hip strengthening in order to decrease knee pain and improve function. HEP updated for proximal hip strengthening. Will continue to progress as able per POC.   EVAL: Patient is a 69 y.o. F who was seen today for physical therapy evaluation and treatment for chronic L knee pain. Physical findings are consistent with referring provider impression as pt demonstrates decrease in L LE strength and decreased overall proximal hip strength. Her LEFS score shows moderate disability in performance of home ADLs and higher level community activities. Pt would benefit from skilled PT services working on improving quad and hip strength in order to decrease knee pain and improve overall comfort.  OBJECTIVE IMPAIRMENTS: Abnormal gait, decreased activity tolerance, decreased endurance, decreased strength, and pain.   ACTIVITY LIMITATIONS: carrying, lifting, standing, squatting, stairs, transfers, and locomotion level  PARTICIPATION LIMITATIONS: shopping, community activity, occupation, and yard work  PERSONAL FACTORS: Time since onset of injury/illness/exacerbation and 1-2 comorbidities: HTN, CVA are also affecting  patient's functional outcome.   REHAB POTENTIAL: Good  CLINICAL DECISION MAKING: Stable/uncomplicated  EVALUATION COMPLEXITY: Low   GOALS: Goals reviewed with patient? No  SHORT TERM GOALS: Target date: 07/03/2024   Pt will be compliant and knowledgeable with initial HEP for improved comfort and carryover Baseline: initial HEP given  Goal status: INITIAL  2.  Pt will self report left knee pain no greater than 7/10 for improved comfort and functional ability Baseline: 10/10 at worst Goal status: INITIAL   LONG TERM GOALS: Target date: 08/09/2024  Pt will improve LEFS to no less than 52/80 as proxy for functional improvement with home ADLs and higher level community activity Baseline: 47/80 Goal status: INITIAL  2.  Pt will self report left knee pain no greater than 4/10 for improved comfort and functional ability Baseline: 10/10 at worst Goal status: INITIAL   3.  Pt will increase 30 Second Sit to Stand rep count to no less than 12 reps for improved balance, strength, and functional mobility Baseline: 12 reps  Goal status: INITIAL   4.  Pt will improve LE MMT to at least 5/5 for all tested motions for improved strength and decreased pain Baseline: see chart Goal status: INITIAL  5.  Pt will be able to squat and lift 25# box with no increase in knee pain to improve comfort and function with work related duties Baseline: unable Goal status: INITIAL    PLAN:  PT FREQUENCY: 1-2x/week  PT DURATION: 8 weeks  PLANNED INTERVENTIONS: 97164- PT Re-evaluation, 97110-Therapeutic exercises, 97530- Therapeutic activity, [id number]- Neuromuscular re-education, 97535- Self Care, 02859- Manual therapy, [id number]- Gait training, [phone]- Electrical stimulation (unattended), [id number]- Electrical stimulation (manual), 20560 (1-2 muscles), 20561 (3+ muscles)- Dry Needling, and Patient/Family education  PLAN FOR NEXT SESSION:  assess HEP response, LE strengthening,    Alm JAYSON Kingdom, PT 06/17/2024,  11:22 AM  "

## 2024-06-24 ENCOUNTER — Ambulatory Visit

## 2024-06-24 ENCOUNTER — Telehealth: Payer: Self-pay | Admitting: *Deleted

## 2024-06-24 DIAGNOSIS — G8929 Other chronic pain: Secondary | ICD-10-CM

## 2024-06-24 DIAGNOSIS — M6281 Muscle weakness (generalized): Secondary | ICD-10-CM

## 2024-06-24 DIAGNOSIS — R2689 Other abnormalities of gait and mobility: Secondary | ICD-10-CM

## 2024-06-24 DIAGNOSIS — M25562 Pain in left knee: Secondary | ICD-10-CM | POA: Diagnosis not present

## 2024-06-24 NOTE — Therapy (Signed)
 " OUTPATIENT PHYSICAL THERAPY TREATMENT   Patient Name: Laurie Contreras MRN: [id number] DOB:1955/12/13, 69 y.o., female Today's Date: 06/24/2024  END OF SESSION:  PT End of Session - 06/24/24 0930     Visit Number 3    Number of Visits 17    Date for Recertification  08/09/24    Authorization Type [employer]    PT Start Time 0930    PT Stop Time 1010    PT Time Calculation (min) 40 min    Activity Tolerance Patient tolerated treatment well    Behavior During Therapy [facility] for tasks assessed/performed            Past Medical History:  Diagnosis Date   Hypertension    Stroke (HCC) 02/26/2024   Tobacco abuse 09/08/2014   Past Surgical History:  Procedure Laterality Date   TONSILLECTOMY     Patient Active Problem List   Diagnosis Date Noted   History of stroke 02/26/2024   Elevated serum creatinine 02/26/2024   Hypokalemia 02/26/2024   Abnormal mammogram of right breast 06/14/2022   Estrogen deficiency 06/14/2022   Acne 06/14/2022   Insomnia 08/08/2020   Gastroesophageal reflux disease    Atypical chest pain 07/28/2020   OA (osteoarthritis) of knee 07/27/2020   Healthcare maintenance 10/07/2019   Bereavement due to life event 09/05/2018   Right shoulder pain 05/20/2018   History of colon polyps 04/09/2017   Left breast mass 03/13/2017   Low back pain 02/22/2017   Hyperlipidemia 02/22/2017   Aortic atherosclerosis 02/21/2017   Unsteady gait    Lower extremity weakness 08/24/2014   Tremor 08/24/2014   Hypertension 08/24/2014    PCP: Rosan Dayton BROCKS, DO   REFERRING PROVIDER: Trudy Mliss Dragon, MD   REFERRING DIAG: [phone] (ICD-10-CM) - Primary osteoarthritis of left knee   THERAPY DIAG:  Chronic pain of left knee  Muscle weakness (generalized)  Other abnormalities of gait and mobility  Rationale for Evaluation and Treatment: Rehabilitation  ONSET DATE: Chronic  SUBJECTIVE:   SUBJECTIVE STATEMENT: Pt presents to PT with reports of no current pain.  Normally on cold days this would have bothered her but her knee is feeling pretty good. Has been compliant with HEP.   EVAL: Pt presents to PT with daughter, she reports chronic history of L knee pain made worse with cold and rainy weather. She had a CVA in September of 2025 with no residual deficits thankfully. There has been no trauma or MOI to her L knee, but she has some occasional buckling and when it is painful she does not really leave the house. Pain is mainly directed at medial knee.  PERTINENT HISTORY: HTN, CVA  PAIN:    Are you having pain?  Yes: NPRS scale: 0/10 Worst: 10/10 Pain location: left knee Pain description: dull ache, sore Aggravating factors: weather, cold Relieving factors: rest  PRECAUTIONS: None  RED FLAGS: None   WEIGHT BEARING RESTRICTIONS: No  FALLS:  Has patient fallen in last 6 months? Yes. Number of falls - one when she had her stroke  LIVING ENVIRONMENT: Lives with: lives with their family Lives in: House/apartment  OCCUPATION: [employer]  PLOF: Independent  PATIENT GOALS: decrease knee pain, exercise more   OBJECTIVE:  Note: Objective measures were completed at Evaluation unless otherwise noted.  DIAGNOSTIC FINDINGS: N/A  PATIENT SURVEYS:  LEFS  Extreme difficulty/unable (0), Quite a bit of difficulty (1), Moderate difficulty (2), Little difficulty (3), No difficulty (4) Survey date:  06/12/24  Any of your  usual work, housework or school activities 4  2. Usual hobbies, recreational or sporting activities 4  3. Getting into/out of the bath 4  4. Walking between rooms 4  5. Putting on socks/shoes 4  6. Squatting  4  7. Lifting an object, like a bag of groceries from the floor 4  8. Performing light activities around your home 4  9. Performing heavy activities around your home 0  10. Getting into/out of a car 4  11. Walking 2 blocks 0  12. Walking 1 mile 0  13. Going up/down 10 stairs (1 flight) 4  14. Standing for 1 hour 3  15.   sitting for 1 hour 4  16. Running on even ground 0  17. Running on uneven ground 0  18. Making sharp turns while running fast 0  19. Hopping  0  20. Rolling over in bed 4  Score total:  47/80     COGNITION: Overall cognitive status: Within functional limits for tasks assessed     SENSATION: WFL  EDEMA:  DNT  POSTURE: rounded shoulders, forward head, and L genu valgus  PALPATION: TTP to L medial knee joint line, distal L quad  LOWER EXTREMITY ROM:  Active ROM Right eval Left eval  Hip flexion    Hip extension    Hip abduction    Hip adduction    Hip internal rotation    Hip external rotation    Knee flexion WNL WNL  Knee extension WNL WNL  Ankle dorsiflexion    Ankle plantarflexion    Ankle inversion    Ankle eversion     (Blank rows = not tested)  LOWER EXTREMITY MMT:  MMT Right eval Left eval  Hip flexion 4 3  Hip extension    Hip abduction 3+ 3+  Hip adduction    Hip internal rotation    Hip external rotation    Knee flexion 5 4  Knee extension 5 4  Ankle dorsiflexion    Ankle plantarflexion    Ankle inversion    Ankle eversion     (Blank rows = not tested)  LOWER EXTREMITY SPECIAL TESTS:  Knee special tests: Anterior drawer test: negative, Posterior drawer test: negative, and Lachman Test: negative  FUNCTIONAL TESTS:  30 Second Sit to Stand: 12 reps  GAIT: Distance walked: 2ft Assistive device utilized: None Level of assistance: Complete Independence Comments: decreased gait speed   TREATMENT: [facility] Adult PT Treatment:                                                DATE: 06/24/24 NuStep lvl 5 UE/LE x 5 min for functional activity  LAQ 2x10 4# STS 2x10 10# DB FM squat 2x10 B UE Lateral walk YTB x 2 laps at counter Standing hip abd 2x10 YTB each  [facility] Adult PT Treatment:                                                DATE: 06/17/24 NuStep lvl 5 UE/LE x 5 min for functional activity  LAQ 2x10 3# Supine SLR 2x10 ea FM squat 2x8 B  UE Standing hip 2x10 YTB TKE with ball 2x10 - 5 hold L  [facility] Adult PT  Treatment:                                                DATE: 06/12/24 Therapeutic Exercise: Supine QS x 5 - 5 hold SLR x 5 - L LE Seated clamshell x 15 blue band LAQ x 5 L  PATIENT EDUCATION:  Education details: eval findings, LEFS, HEP, POC Person educated: Patient Education method: Explanation, Demonstration, and Handouts Education comprehension: verbalized understanding and returned demonstration  HOME EXERCISE PROGRAM: Access Code: [email or web address] URL: [email or web address] Date: 06/24/2024 Prepared by: Alm Kingdom  Exercises - Supine Quadricep Sets  - 1-2 x daily - 7 x weekly - 2 sets - 10 reps - 5 sec hold - Active Straight Leg Raise with Quad Set  - 1-2 x daily - 7 x weekly - 2-3 sets - 10 reps - Hooklying Clamshell with Resistance  - 1-2 x daily - 7 x weekly - 3 sets - 15 reps - blue  hold - Seated Long Arc Quad  - 1-2 x daily - 7 x weekly - 2-3 sets - 10 reps - 5 sec hold - Standing Hip Abduction with Resistance at Ankles and Counter Support  - 1 x daily - 7 x weekly - 2 sets - 10 reps - yellow band hold - Side Stepping with Resistance at Ankles and Counter Support  - 1 x daily - 3 x weekly - 2-3 reps - yellow band hold  ASSESSMENT:  CLINICAL IMPRESSION: Pt was able to complete all prescribed exercises with no adverse effect. Today we focused on quad and hip strengthening in order to decrease knee pain and improve function. HEP updated for proximal hip strengthening standing at counter. Will continue to progress as able per POC.   EVAL: Patient is a 69 y.o. F who was seen today for physical therapy evaluation and treatment for chronic L knee pain. Physical findings are consistent with referring provider impression as pt demonstrates decrease in L LE strength and decreased overall proximal hip strength. Her LEFS score shows moderate disability in performance of home ADLs and higher level  community activities. Pt would benefit from skilled PT services working on improving quad and hip strength in order to decrease knee pain and improve overall comfort.  OBJECTIVE IMPAIRMENTS: Abnormal gait, decreased activity tolerance, decreased endurance, decreased strength, and pain.   ACTIVITY LIMITATIONS: carrying, lifting, standing, squatting, stairs, transfers, and locomotion level  PARTICIPATION LIMITATIONS: shopping, community activity, occupation, and yard work  PERSONAL FACTORS: Time since onset of injury/illness/exacerbation and 1-2 comorbidities: HTN, CVA are also affecting patient's functional outcome.   REHAB POTENTIAL: Good  CLINICAL DECISION MAKING: Stable/uncomplicated  EVALUATION COMPLEXITY: Low   GOALS: Goals reviewed with patient? No  SHORT TERM GOALS: Target date: 07/03/2024   Pt will be compliant and knowledgeable with initial HEP for improved comfort and carryover Baseline: initial HEP given  Goal status: INITIAL  2.  Pt will self report left knee pain no greater than 7/10 for improved comfort and functional ability Baseline: 10/10 at worst Goal status: INITIAL   LONG TERM GOALS: Target date: 08/09/2024  Pt will improve LEFS to no less than 52/80 as proxy for functional improvement with home ADLs and higher level community activity Baseline: 47/80 Goal status: INITIAL  2.  Pt will self report left knee pain no greater than 4/10 for improved comfort and functional ability Baseline:  10/10 at worst Goal status: INITIAL   3.  Pt will increase 30 Second Sit to Stand rep count to no less than 12 reps for improved balance, strength, and functional mobility Baseline: 12 reps  Goal status: INITIAL   4.  Pt will improve LE MMT to at least 5/5 for all tested motions for improved strength and decreased pain Baseline: see chart Goal status: INITIAL  5.  Pt will be able to squat and lift 25# box with no increase in knee pain to improve comfort and function with  work related duties Baseline: unable Goal status: INITIAL    PLAN:  PT FREQUENCY: 1-2x/week  PT DURATION: 8 weeks  PLANNED INTERVENTIONS: 97164- PT Re-evaluation, 97110-Therapeutic exercises, 97530- Therapeutic activity, [id number]- Neuromuscular re-education, 97535- Self Care, 02859- Manual therapy, [id number]- Gait training, [phone]- Electrical stimulation (unattended), [id number]- Electrical stimulation (manual), 20560 (1-2 muscles), 20561 (3+ muscles)- Dry Needling, and Patient/Family education  PLAN FOR NEXT SESSION: assess HEP response, LE strengthening,    Alm JAYSON Kingdom, PT 06/24/2024, 10:21 AM  "

## 2024-06-24 NOTE — Telephone Encounter (Addendum)
 RTC to patient request a Swallow Test.  States has been having times when she chokes on her food.  Can be solid or liquid.  Patient was given an appointment to come in on tomorrow with Dr. Kandis.                                                Copied from CRM #[id number]. Topic: Clinical - Request for Lab/Test Order >> Jun 24, 2024 11:20 AM Mercer PEDLAR wrote: Reason for CRM: Patient is requesting an order for a swallow test. Patient would like a call when completed.

## 2024-06-25 ENCOUNTER — Other Ambulatory Visit (HOSPITAL_COMMUNITY): Payer: Self-pay | Admitting: *Deleted

## 2024-06-25 ENCOUNTER — Ambulatory Visit: Payer: Self-pay | Admitting: Student

## 2024-06-25 VITALS — BP 152/71 | HR 64 | Temp 98.1°F | Ht 67.0 in | Wt 177.8 lb

## 2024-06-25 DIAGNOSIS — I10 Essential (primary) hypertension: Secondary | ICD-10-CM | POA: Diagnosis not present

## 2024-06-25 DIAGNOSIS — T17208A Unspecified foreign body in pharynx causing other injury, initial encounter: Secondary | ICD-10-CM | POA: Diagnosis not present

## 2024-06-25 DIAGNOSIS — R1312 Dysphagia, oropharyngeal phase: Secondary | ICD-10-CM

## 2024-06-25 DIAGNOSIS — F17210 Nicotine dependence, cigarettes, uncomplicated: Secondary | ICD-10-CM

## 2024-06-25 DIAGNOSIS — Z720 Tobacco use: Secondary | ICD-10-CM

## 2024-06-25 DIAGNOSIS — R1310 Dysphagia, unspecified: Secondary | ICD-10-CM

## 2024-06-25 MED ORDER — VARENICLINE TARTRATE (STARTER) 0.5 MG X 11 & 1 MG X 42 PO TBPK: ORAL_TABLET | ORAL | 0 refills | Status: AC

## 2024-06-25 MED ORDER — OMEPRAZOLE MAGNESIUM 20 MG PO TBEC: 20.0000 mg | DELAYED_RELEASE_TABLET | Freq: Every day | ORAL | 0 refills | Status: AC | PRN

## 2024-06-25 NOTE — Assessment & Plan Note (Signed)
 Patient reports that she started smoking again towards the end of the year.  She is currently smoking 6 cigarettes a day.  She recently stopped smoking in October after her CVA and was able to successfully stop after using Chantix .  Patient is interested in starting Chantix  again to help her stop smoking. Plan: -Chantix  ordered

## 2024-06-25 NOTE — Patient Instructions (Signed)
 Thank you, Ms.Yamilee F Methvin for allowing us  to provide your care today. Today we discussed difficulty swallowing, smoking, and blood pressure control.  For the chantix :   Please select a quit date for about one week after starting Chantix . For Days 1-3, please take one tablet (0.5 mg) once a day  For Days 4-7, please take one tablet (0.5 mg) twice a day (one in the morning and one in the evening)  From Day 8 and on, please take one tablet (1 mg) twice a day one in the morning and one in the evening)  You will continue this medication for about a total of 11- 12 weeks.      I have ordered the following medication/changed the following medications:   Stop the following medications: Medications Discontinued During This Encounter  Medication Reason   omeprazole  (PRILOSEC  OTC) 20 MG tablet Reorder     Start the following medications: Meds ordered this encounter  Medications   Varenicline  Tartrate, Starter, (CHANTIX  STARTING MONTH PAK) 0.5 MG X 11 & 1 MG X 42 TBPK    Sig: Take 0.5 mg by mouth daily for 3 days, THEN 0.5 mg 2 (two) times daily for 4 days, THEN 1 mg 2 (two) times daily.    Dispense:  41 each    Refill:  0   omeprazole  (PRILOSEC  OTC) 20 MG tablet    Sig: Take 1 tablet (20 mg total) by mouth daily as needed.    Dispense:  30 tablet    Refill:  0     Follow up: 1-3 months for BP check up    Remember:   Should you have any questions or concerns please call the internal medicine clinic at [phone].     Please note that our late policy has changed.  If you are more than 15 minutes late to your appointment, you may be asked to reschedule your appointment.  Dr. Kandis, D.O. [facility]

## 2024-06-25 NOTE — Assessment & Plan Note (Signed)
 Patient presents with a history of oropharyngeal dysphagia associated with aspiration event.  She reports that she had an aspiration event at least 3 times since June with the last 1 being last Monday.  She denies any shortness of breath today.  She denies new cough, followed food being stuck in her esophagus, any issues with pills being stuck in the esophagus.  She denies any fever or chills today.  She also denies weight loss or melena.  She denies heartburn.  Patient's daughter was concerned because at that time patient had a nosebleed and did cough up some blood (blood was from nosebleed per patient).  Plan: Without red flags present and history supporting more of an oropharyngeal dysphagia versus esophageal dysphagia, will send to speech therapy and obtain MBS

## 2024-06-25 NOTE — Progress Notes (Signed)
 "  Established Patient Office Visit  Subjective   Patient ID: Laurie Contreras, female    DOB: 12/22/1955  Age: 69 y.o. MRN: [id number]  Chief Complaint  Patient presents with   Medication Refill    Laurie Contreras is a 69 y.o. who presents to the clinic for dysphagia, smoking, and HTN. Please see problem based assessment and plan for additional details.  Patient Active Problem List   Diagnosis Date Noted   Oropharyngeal dysphagia 06/25/2024   History of stroke 02/26/2024   Elevated serum creatinine 02/26/2024   Hypokalemia 02/26/2024   Abnormal mammogram of right breast 06/14/2022   Estrogen deficiency 06/14/2022   Acne 06/14/2022   Insomnia 08/08/2020   Gastroesophageal reflux disease    Atypical chest pain 07/28/2020   OA (osteoarthritis) of knee 07/27/2020   Healthcare maintenance 10/07/2019   Bereavement due to life event 09/05/2018   Right shoulder pain 05/20/2018   History of colon polyps 04/09/2017   Left breast mass 03/13/2017   Low back pain 02/22/2017   Hyperlipidemia 02/22/2017   Aortic atherosclerosis 02/21/2017   Tobacco use 09/08/2014   Unsteady gait    Lower extremity weakness 08/24/2014   Tremor 08/24/2014   Hypertension 08/24/2014       Objective:     BP (!) 152/71 (BP Location: Left Arm, Patient Position: Sitting, Cuff Size: Small)   Pulse 64   Temp 98.1 F (36.7 C) (Oral)   Ht 5' 7 (1.702 m)   Wt 177 lb 12.8 oz (80.6 kg)   SpO2 95%   BMI 27.85 kg/m  BP Readings from Last 3 Encounters:  06/25/24 (!) 152/71  05/26/24 136/75  04/28/24 136/63   Wt Readings from Last 3 Encounters:  06/25/24 177 lb 12.8 oz (80.6 kg)  05/26/24 177 lb 6.4 oz (80.5 kg)  04/28/24 175 lb 6.4 oz (79.6 kg)      Physical Exam Vitals reviewed.  Constitutional:      General: She is not in acute distress.    Appearance: She is not ill-appearing or toxic-appearing.  Cardiovascular:     Rate and Rhythm: Normal rate and regular rhythm.  Pulmonary:     Effort:  Pulmonary effort is normal. No respiratory distress.     Breath sounds: Normal breath sounds. No wheezing.  Skin:    General: Skin is warm and dry.  Neurological:     Mental Status: She is alert.  Psychiatric:        Mood and Affect: Mood and affect normal.      No results found for any visits on 06/25/24.  Last metabolic panel Lab Results  Component Value Date   GLUCOSE 107 (H) 02/27/2024   NA 139 02/27/2024   K 4.0 02/27/2024   CL 109 02/27/2024   CO2 21 (L) 02/27/2024   BUN 7 (L) 02/27/2024   CREATININE 0.77 02/27/2024   GFRNONAA >60 02/27/2024   CALCIUM  10.1 02/27/2024   PROT 8.3 (H) 02/26/2024   ALBUMIN 4.1 02/26/2024   LABGLOB 3.2 12/30/2023   AGRATIO 1.3 02/21/2017   BILITOT 0.9 02/26/2024   ALKPHOS 98 02/26/2024   AST 26 02/26/2024   ALT 18 02/26/2024   ANIONGAP 9 02/27/2024   Last lipids Lab Results  Component Value Date   CHOL 107 02/26/2024   HDL 34 (L) 02/26/2024   LDLCALC 55 02/26/2024   TRIG 88 02/26/2024   CHOLHDL 3.1 02/26/2024      The ASCVD Risk score (Arnett DK, et al., 2019) failed to  calculate for the following reasons:   Risk score cannot be calculated because patient has a medical history suggesting prior/existing ASCVD   * - Cholesterol units were assumed    Assessment & Plan:   Problem List Items Addressed This Visit       Cardiovascular and Mediastinum   Hypertension   Patient presents with a history of hypertension with a blood pressure today of 164/76, recheck at 152/71. Their hypertension is uncontrolled on a regimen of amlodipine  10mg -Benazepril  20 mg. They check their blood pressure at home with an average of unknown. I suspect patient's BP increased due to recent smoking.  Plan: -Continue current regimen of:amlodipine  10mg -Benazepril  20 mg -Recommended patient check and log her BP at home        Respiratory   Oropharyngeal dysphagia   Patient presents with a history of oropharyngeal dysphagia associated with  aspiration event.  She reports that she had an aspiration event at least 3 times since June with the last 1 being last Monday.  She denies any shortness of breath today.  She denies new cough, followed food being stuck in her esophagus, any issues with pills being stuck in the esophagus.  She denies any fever or chills today.  She also denies weight loss or melena.  She denies heartburn.  Patient's daughter was concerned because at that time patient had a nosebleed and did cough up some blood (blood was from nosebleed per patient).  Plan: Without red flags present and history supporting more of an oropharyngeal dysphagia versus esophageal dysphagia, will send to speech therapy and obtain MBS        Other   Tobacco use   Patient reports that she started smoking again towards the end of the year.  She is currently smoking 6 cigarettes a day.  She recently stopped smoking in October after her CVA and was able to successfully stop after using Chantix .  Patient is interested in starting Chantix  again to help her stop smoking. Plan: -Chantix  ordered       Other Visit Diagnoses       Oropharyngeal aspiration, initial encounter    -  Primary   Relevant Orders   SLP modified barium swallow   SLP swallowing-dysphagia       Return in about 3 months (around 09/23/2024) for BP.    Damien Lease, DO  "

## 2024-06-25 NOTE — Assessment & Plan Note (Signed)
 Patient presents with a history of hypertension with a blood pressure today of 164/76, recheck at 152/71. Their hypertension is uncontrolled on a regimen of amlodipine  10mg -Benazepril  20 mg. They check their blood pressure at home with an average of unknown. I suspect patient's BP increased due to recent smoking.  Plan: -Continue current regimen of:amlodipine  10mg -Benazepril  20 mg -Recommended patient check and log her BP at home

## 2024-06-25 NOTE — Progress Notes (Signed)
 Internal Medicine Clinic Attending  Case discussed with the resident at the time of the visit.  We reviewed the resident's history and exam and pertinent patient test results.  I agree with the assessment, diagnosis, and plan of care documented in the resident's note.

## 2024-07-01 ENCOUNTER — Other Ambulatory Visit: Payer: Self-pay | Admitting: Student

## 2024-07-01 ENCOUNTER — Ambulatory Visit: Admitting: Physical Therapy

## 2024-07-01 NOTE — Progress Notes (Addendum)
 Discussed long term disability forms with patient's daughter and patient on the phone. {Patient last seen in office on 01/22/206.  Forms completed.

## 2024-07-02 ENCOUNTER — Telehealth: Payer: Self-pay

## 2024-07-02 NOTE — Telephone Encounter (Signed)
 I contacted pt's daughter-Jocquita to let her know that form has been completed and faxed, confirmation went through. Advised to call back if she needs further assistance.

## 2024-07-08 ENCOUNTER — Ambulatory Visit

## 2024-07-08 DIAGNOSIS — M6281 Muscle weakness (generalized): Secondary | ICD-10-CM

## 2024-07-08 DIAGNOSIS — G8929 Other chronic pain: Secondary | ICD-10-CM

## 2024-07-08 NOTE — Therapy (Signed)
 " OUTPATIENT PHYSICAL THERAPY TREATMENT   Patient Name: Laurie Contreras MRN: [id number] DOB:[birth year], 69 y.o., female Today's Date: 07/08/2024  END OF SESSION:  PT End of Session - 07/08/24 0928     Visit Number 4    Number of Visits 17    Date for Recertification  08/09/24    Authorization Type [employer]    PT Start Time 0930    PT Stop Time 1010    PT Time Calculation (min) 40 min    Activity Tolerance Patient tolerated treatment well    Behavior During Therapy [facility] for tasks assessed/performed             Past Medical History:  Diagnosis Date   Hypertension    Stroke (HCC) 02/26/2024   Tobacco abuse 09/08/2014   Past Surgical History:  Procedure Laterality Date   TONSILLECTOMY     Patient Active Problem List   Diagnosis Date Noted   Oropharyngeal dysphagia 06/25/2024   History of stroke 02/26/2024   Elevated serum creatinine 02/26/2024   Hypokalemia 02/26/2024   Abnormal mammogram of right breast 06/14/2022   Estrogen deficiency 06/14/2022   Acne 06/14/2022   Insomnia 08/08/2020   Gastroesophageal reflux disease    Atypical chest pain 07/28/2020   OA (osteoarthritis) of knee 07/27/2020   Healthcare maintenance 10/07/2019   Bereavement due to life event 09/05/2018   Right shoulder pain 05/20/2018   History of colon polyps 04/09/2017   Left breast mass 03/13/2017   Low back pain 02/22/2017   Hyperlipidemia 02/22/2017   Aortic atherosclerosis 02/21/2017   Tobacco use 09/08/2014   Unsteady gait    Lower extremity weakness 08/24/2014   Tremor 08/24/2014   Hypertension 08/24/2014    PCP: Rosan Dayton BROCKS, DO   REFERRING PROVIDER: Trudy Mliss Dragon, MD   REFERRING DIAG: [phone] (ICD-10-CM) - Primary osteoarthritis of left knee   THERAPY DIAG:  Chronic pain of left knee  Muscle weakness (generalized)  Rationale for Evaluation and Treatment: Rehabilitation  ONSET DATE: Chronic  SUBJECTIVE:   SUBJECTIVE STATEMENT: Pt presents to PT with reports of  no current pain. Has been hurting a lot bc of the snow recently. Has been doing HEP as prescribed.   EVAL: Pt presents to PT with daughter, she reports chronic history of L knee pain made worse with cold and rainy weather. She had a CVA in September of 2025 with no residual deficits thankfully. There has been no trauma or MOI to her L knee, but she has some occasional buckling and when it is painful she does not really leave the house. Pain is mainly directed at medial knee.  PERTINENT HISTORY: HTN, CVA  PAIN:    Are you having pain?  Yes: NPRS scale: 0/10 Worst: 10/10 Pain location: left knee Pain description: dull ache, sore Aggravating factors: weather, cold Relieving factors: rest  PRECAUTIONS: None  RED FLAGS: None   WEIGHT BEARING RESTRICTIONS: No  FALLS:  Has patient fallen in last 6 months? Yes. Number of falls - one when she had her stroke  LIVING ENVIRONMENT: Lives with: lives with their family Lives in: House/apartment  OCCUPATION: [employer]  PLOF: Independent  PATIENT GOALS: decrease knee pain, exercise more   OBJECTIVE:  Note: Objective measures were completed at Evaluation unless otherwise noted.  DIAGNOSTIC FINDINGS: N/A  PATIENT SURVEYS:  LEFS  Extreme difficulty/unable (0), Quite a bit of difficulty (1), Moderate difficulty (2), Little difficulty (3), No difficulty (4) Survey date:  06/12/24  Any of your usual  work, housework or school activities 4  2. Usual hobbies, recreational or sporting activities 4  3. Getting into/out of the bath 4  4. Walking between rooms 4  5. Putting on socks/shoes 4  6. Squatting  4  7. Lifting an object, like a bag of groceries from the floor 4  8. Performing light activities around your home 4  9. Performing heavy activities around your home 0  10. Getting into/out of a car 4  11. Walking 2 blocks 0  12. Walking 1 mile 0  13. Going up/down 10 stairs (1 flight) 4  14. Standing for 1 hour 3  15.  sitting for 1  hour 4  16. Running on even ground 0  17. Running on uneven ground 0  18. Making sharp turns while running fast 0  19. Hopping  0  20. Rolling over in bed 4  Score total:  47/80     COGNITION: Overall cognitive status: Within functional limits for tasks assessed     SENSATION: WFL  EDEMA:  DNT  POSTURE: rounded shoulders, forward head, and L genu valgus  PALPATION: TTP to L medial knee joint line, distal L quad  LOWER EXTREMITY ROM:  Active ROM Right eval Left eval  Hip flexion    Hip extension    Hip abduction    Hip adduction    Hip internal rotation    Hip external rotation    Knee flexion WNL WNL  Knee extension WNL WNL  Ankle dorsiflexion    Ankle plantarflexion    Ankle inversion    Ankle eversion     (Blank rows = not tested)  LOWER EXTREMITY MMT:  MMT Right eval Left eval  Hip flexion 4 3  Hip extension    Hip abduction 3+ 3+  Hip adduction    Hip internal rotation    Hip external rotation    Knee flexion 5 4  Knee extension 5 4  Ankle dorsiflexion    Ankle plantarflexion    Ankle inversion    Ankle eversion     (Blank rows = not tested)  LOWER EXTREMITY SPECIAL TESTS:  Knee special tests: Anterior drawer test: negative, Posterior drawer test: negative, and Lachman Test: negative  FUNCTIONAL TESTS:  30 Second Sit to Stand: 12 reps  GAIT: Distance walked: 23ft Assistive device utilized: None Level of assistance: Complete Independence Comments: decreased gait speed   TREATMENT:  07/08/24 Therapeutic Activity/Exercise: Nustep x5 min Seated hip march x6x3s, RTB x6x3s Seated RTB clamshell 2x8x3s, GTB x6x3s Seated LAQ x8x3s, BL 2x6x3s RTB STS lowest table x6, 2x6 5# Calf raise w/ counter x8x3s RTB HS curl x8x3s  [facility] Adult PT Treatment:                                                DATE: 06/24/24 NuStep lvl 5 UE/LE x 5 min for functional activity  LAQ 2x10 4# STS 2x10 10# DB FM squat 2x10 B UE Lateral walk YTB x 2 laps at  counter Standing hip abd 2x10 YTB each  [facility] Adult PT Treatment:                                                DATE:  06/17/24 NuStep lvl 5 UE/LE x 5 min for functional activity  LAQ 2x10 3# Supine SLR 2x10 ea FM squat 2x8 B UE Standing hip 2x10 YTB TKE with ball 2x10 - 5 hold L  [facility] Adult PT Treatment:                                                DATE: 06/12/24 Therapeutic Exercise: Supine QS x 5 - 5 hold SLR x 5 - L LE Seated clamshell x 15 blue band LAQ x 5 L  PATIENT EDUCATION:  Education details: eval findings, LEFS, HEP, POC Person educated: Patient Education method: Explanation, Demonstration, and Handouts Education comprehension: verbalized understanding and returned demonstration  HOME EXERCISE PROGRAM: Access Code: [email or web address] URL: [email or web address] Date: 06/24/2024 Prepared by: Alm Kingdom  Exercises - Supine Quadricep Sets  - 1-2 x daily - 7 x weekly - 2 sets - 10 reps - 5 sec hold - Active Straight Leg Raise with Quad Set  - 1-2 x daily - 7 x weekly - 2-3 sets - 10 reps - Hooklying Clamshell with Resistance  - 1-2 x daily - 7 x weekly - 3 sets - 15 reps - blue  hold - Seated Long Arc Quad  - 1-2 x daily - 7 x weekly - 2-3 sets - 10 reps - 5 sec hold - Standing Hip Abduction with Resistance at Ankles and Counter Support  - 1 x daily - 7 x weekly - 2 sets - 10 reps - yellow band hold - Side Stepping with Resistance at Ankles and Counter Support  - 1 x daily - 3 x weekly - 2-3 reps - yellow band hold  ASSESSMENT:  CLINICAL IMPRESSION: Pt was able to complete all prescribed exercises with no adverse effect. Today we focused on quad and hip strengthening in order to decrease knee pain and improve function. Will continue to progress as able per POC.   EVAL: Patient is a 69 y.o. F who was seen today for physical therapy evaluation and treatment for chronic L knee pain. Physical findings are consistent with referring provider impression as pt  demonstrates decrease in L LE strength and decreased overall proximal hip strength. Her LEFS score shows moderate disability in performance of home ADLs and higher level community activities. Pt would benefit from skilled PT services working on improving quad and hip strength in order to decrease knee pain and improve overall comfort.  OBJECTIVE IMPAIRMENTS: Abnormal gait, decreased activity tolerance, decreased endurance, decreased strength, and pain.   ACTIVITY LIMITATIONS: carrying, lifting, standing, squatting, stairs, transfers, and locomotion level  PARTICIPATION LIMITATIONS: shopping, community activity, occupation, and yard work  PERSONAL FACTORS: Time since onset of injury/illness/exacerbation and 1-2 comorbidities: HTN, CVA are also affecting patient's functional outcome.   REHAB POTENTIAL: Good  CLINICAL DECISION MAKING: Stable/uncomplicated  EVALUATION COMPLEXITY: Low   GOALS: Goals reviewed with patient? No  SHORT TERM GOALS: Target date: 07/03/2024   Pt will be compliant and knowledgeable with initial HEP for improved comfort and carryover Baseline: initial HEP given  Goal status: INITIAL  2.  Pt will self report left knee pain no greater than 7/10 for improved comfort and functional ability Baseline: 10/10 at worst Goal status: INITIAL   LONG TERM GOALS: Target date: 08/09/2024  Pt will improve LEFS to no less than 52/80 as proxy for functional improvement with home ADLs  and higher level community activity Baseline: 47/80 Goal status: INITIAL  2.  Pt will self report left knee pain no greater than 4/10 for improved comfort and functional ability Baseline: 10/10 at worst Goal status: INITIAL   3.  Pt will increase 30 Second Sit to Stand rep count to no less than 12 reps for improved balance, strength, and functional mobility Baseline: 12 reps  Goal status: INITIAL   4.  Pt will improve LE MMT to at least 5/5 for all tested motions for improved strength and  decreased pain Baseline: see chart Goal status: INITIAL  5.  Pt will be able to squat and lift 25# box with no increase in knee pain to improve comfort and function with work related duties Baseline: unable Goal status: INITIAL    PLAN:  PT FREQUENCY: 1-2x/week  PT DURATION: 8 weeks  PLANNED INTERVENTIONS: [id number]- PT Re-evaluation, 97110-Therapeutic exercises, 97530- Therapeutic activity, [id number]- Neuromuscular re-education, 97535- Self Care, [id number]- Manual therapy, [id number]- Gait training, [phone]- Electrical stimulation (unattended), [id number]- Electrical stimulation (manual), 20560 (1-2 muscles), [id number] (3+ muscles)- Dry Needling, and Patient/Family education  PLAN FOR NEXT SESSION: assess HEP response, LE strengthening,    [employer], PT 07/08/2024, 10:15 AM  "

## 2024-07-15 ENCOUNTER — Ambulatory Visit: Admitting: Physical Therapy

## 2024-07-21 ENCOUNTER — Encounter (HOSPITAL_COMMUNITY)

## 2024-08-03 ENCOUNTER — Ambulatory Visit: Payer: Self-pay | Admitting: Internal Medicine

## 2024-08-10 ENCOUNTER — Ambulatory Visit: Payer: Self-pay | Admitting: Internal Medicine

## 2024-08-11 ENCOUNTER — Ambulatory Visit
